# Patient Record
Sex: Female | Born: 1965 | Race: Black or African American | Hispanic: No | Smoking: Current every day smoker
Health system: Southern US, Community
[De-identification: ages and names within clinical notes are randomized; demographics above are authoritative.]

## PROBLEM LIST (undated history)

## (undated) DIAGNOSIS — R072 Precordial pain: Secondary | ICD-10-CM

## (undated) DIAGNOSIS — F1010 Alcohol abuse, uncomplicated: Secondary | ICD-10-CM

## (undated) DIAGNOSIS — F1910 Other psychoactive substance abuse, uncomplicated: Secondary | ICD-10-CM

## (undated) DIAGNOSIS — R062 Wheezing: Secondary | ICD-10-CM

## (undated) DIAGNOSIS — I10 Essential (primary) hypertension: Secondary | ICD-10-CM

## (undated) DIAGNOSIS — R079 Chest pain, unspecified: Secondary | ICD-10-CM

## (undated) DIAGNOSIS — F319 Bipolar disorder, unspecified: Secondary | ICD-10-CM

## (undated) DIAGNOSIS — F329 Major depressive disorder, single episode, unspecified: Secondary | ICD-10-CM

## (undated) DIAGNOSIS — Z91199 Patient's noncompliance with other medical treatment and regimen due to unspecified reason: Secondary | ICD-10-CM

## (undated) DIAGNOSIS — Z9119 Patient's noncompliance with other medical treatment and regimen: Secondary | ICD-10-CM

## (undated) DIAGNOSIS — Z720 Tobacco use: Secondary | ICD-10-CM

## (undated) DIAGNOSIS — F32A Depression, unspecified: Secondary | ICD-10-CM

## (undated) DIAGNOSIS — G43909 Migraine, unspecified, not intractable, without status migrainosus: Secondary | ICD-10-CM

## (undated) DIAGNOSIS — G44009 Cluster headache syndrome, unspecified, not intractable: Secondary | ICD-10-CM

## (undated) DIAGNOSIS — M1990 Unspecified osteoarthritis, unspecified site: Secondary | ICD-10-CM

## (undated) HISTORY — PX: TUBAL LIGATION: SHX77

## (undated) HISTORY — DX: Chest pain, unspecified: R07.9

## (undated) HISTORY — DX: Cluster headache syndrome, unspecified, not intractable: G44.009

## (undated) HISTORY — DX: Migraine, unspecified, not intractable, without status migrainosus: G43.909

## (undated) HISTORY — PX: APPENDECTOMY: SHX54

## (undated) HISTORY — PX: FINGER FRACTURE SURGERY: SHX638

## (undated) HISTORY — DX: Unspecified osteoarthritis, unspecified site: M19.90

## (undated) HISTORY — DX: Depression, unspecified: F32.A

## (undated) HISTORY — DX: Major depressive disorder, single episode, unspecified: F32.9

## (undated) HISTORY — PX: GASTRIC BYPASS: SHX52

## (undated) HISTORY — DX: Wheezing: R06.2

## (undated) HISTORY — PX: SKIN GRAFT: SHX250

---

## 2012-07-28 ENCOUNTER — Inpatient Hospital Stay: Payer: Self-pay | Admitting: Psychiatry

## 2012-07-28 LAB — CBC
HCT: 39.3 % (ref 35.0–47.0)
HGB: 13.1 g/dL (ref 12.0–16.0)
MCH: 23.9 pg — ABNORMAL LOW (ref 26.0–34.0)
MCHC: 33.3 g/dL (ref 32.0–36.0)
Platelet: 300 10*3/uL (ref 150–440)
RBC: 5.48 10*6/uL — ABNORMAL HIGH (ref 3.80–5.20)
RDW: 14.5 % (ref 11.5–14.5)

## 2012-07-28 LAB — COMPREHENSIVE METABOLIC PANEL
Albumin: 3.3 g/dL — ABNORMAL LOW (ref 3.4–5.0)
Alkaline Phosphatase: 68 U/L (ref 50–136)
BUN: 12 mg/dL (ref 7–18)
Bilirubin,Total: 0.5 mg/dL (ref 0.2–1.0)
Co2: 22 mmol/L (ref 21–32)
Creatinine: 0.6 mg/dL (ref 0.60–1.30)
EGFR (Non-African Amer.): >60
Osmolality: 279 (ref 275–301)
SGOT(AST): 27 U/L (ref 15–37)
SGPT (ALT): 26 U/L (ref 12–78)
Sodium: 139 mmol/L (ref 136–145)
Total Protein: 6.6 g/dL (ref 6.4–8.2)

## 2012-07-28 LAB — ETHANOL: Ethanol %: 0.1 % — ABNORMAL HIGH (ref 0.000–0.080)

## 2012-07-28 LAB — DRUG SCREEN, URINE
Amphetamines, Ur Screen: NEGATIVE (ref ?–1000)
Barbiturates, Ur Screen: NEGATIVE (ref ?–200)
Benzodiazepine, Ur Scrn: NEGATIVE (ref ?–200)
Methadone, Ur Screen: NEGATIVE (ref ?–300)
Opiate, Ur Screen: NEGATIVE (ref ?–300)
Phencyclidine (PCP) Ur S: NEGATIVE (ref ?–25)
Tricyclic, Ur Screen: NEGATIVE (ref ?–1000)

## 2012-07-28 LAB — PREGNANCY, URINE: Pregnancy Test, Urine: NEGATIVE m[IU]/mL

## 2012-07-28 LAB — URINALYSIS, COMPLETE
Blood: NEGATIVE
Glucose,UR: NEGATIVE mg/dL (ref 0–75)
Leukocyte Esterase: NEGATIVE
Nitrite: NEGATIVE
Ph: 5 (ref 4.5–8.0)
WBC UR: 2 /HPF (ref 0–5)

## 2012-07-28 LAB — SALICYLATE LEVEL: Salicylates, Serum: <1.7 mg/dL

## 2012-07-28 LAB — TSH: Thyroid Stimulating Horm: 0.18 u[IU]/mL — ABNORMAL LOW

## 2013-01-20 ENCOUNTER — Emergency Department: Payer: Self-pay | Admitting: Emergency Medicine

## 2013-01-20 LAB — CBC
HGB: 12.4 g/dL (ref 12.0–16.0)
MCHC: 32.6 g/dL (ref 32.0–36.0)
MCV: 74 fL — ABNORMAL LOW (ref 80–100)
RDW: 15.9 % — ABNORMAL HIGH (ref 11.5–14.5)

## 2013-01-20 LAB — COMPREHENSIVE METABOLIC PANEL
Albumin: 3.6 g/dL (ref 3.4–5.0)
Alkaline Phosphatase: 94 U/L (ref 50–136)
Anion Gap: 6 — ABNORMAL LOW (ref 7–16)
BUN: 10 mg/dL (ref 7–18)
Calcium, Total: 8.9 mg/dL (ref 8.5–10.1)
Chloride: 110 mmol/L — ABNORMAL HIGH (ref 98–107)
Co2: 25 mmol/L (ref 21–32)
Creatinine: 0.68 mg/dL (ref 0.60–1.30)
Glucose: 69 mg/dL (ref 65–99)
SGPT (ALT): 31 U/L (ref 12–78)
Total Protein: 7.2 g/dL (ref 6.4–8.2)

## 2013-01-20 LAB — TROPONIN I: Troponin-I: 0.04 ng/mL

## 2013-06-11 ENCOUNTER — Emergency Department: Payer: Self-pay

## 2013-06-11 LAB — CBC
MCH: 24.5 pg — ABNORMAL LOW (ref 26.0–34.0)
MCHC: 33.5 g/dL (ref 32.0–36.0)
MCV: 73 fL — ABNORMAL LOW (ref 80–100)
RBC: 5.6 10*6/uL — ABNORMAL HIGH (ref 3.80–5.20)
RDW: 14.6 % — ABNORMAL HIGH (ref 11.5–14.5)
WBC: 5.5 10*3/uL (ref 3.6–11.0)

## 2013-06-11 LAB — URINALYSIS, COMPLETE
Bacteria: NONE SEEN
Glucose,UR: NEGATIVE mg/dL (ref 0–75)
Ketone: NEGATIVE
Leukocyte Esterase: NEGATIVE
Nitrite: NEGATIVE
Protein: NEGATIVE
Specific Gravity: 1.014 (ref 1.003–1.030)

## 2013-06-11 LAB — COMPREHENSIVE METABOLIC PANEL
Anion Gap: 9 (ref 7–16)
BUN: 9 mg/dL (ref 7–18)
Bilirubin,Total: 0.7 mg/dL (ref 0.2–1.0)
Co2: 20 mmol/L — ABNORMAL LOW (ref 21–32)
EGFR (African American): >60
EGFR (Non-African Amer.): >60
Glucose: 109 mg/dL — ABNORMAL HIGH (ref 65–99)
Potassium: 4.1 mmol/L (ref 3.5–5.1)
SGOT(AST): 31 U/L (ref 15–37)
SGPT (ALT): 27 U/L (ref 12–78)
Sodium: 142 mmol/L (ref 136–145)
Total Protein: 7.2 g/dL (ref 6.4–8.2)

## 2013-06-11 LAB — DRUG SCREEN, URINE
Benzodiazepine, Ur Scrn: NEGATIVE (ref ?–200)
Cannabinoid 50 Ng, Ur ~~LOC~~: NEGATIVE (ref ?–50)
MDMA (Ecstasy)Ur Screen: NEGATIVE (ref ?–500)
Methadone, Ur Screen: NEGATIVE (ref ?–300)
Phencyclidine (PCP) Ur S: NEGATIVE (ref ?–25)
Tricyclic, Ur Screen: NEGATIVE (ref ?–1000)

## 2013-06-11 LAB — ETHANOL
Ethanol %: 0.153 % — ABNORMAL HIGH (ref 0.000–0.080)
Ethanol: 153 mg/dL
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
## 2014-12-26 NOTE — Consult Note (Signed)
Brief Consult Note: Diagnosis: depression nos, alcohol abuse.   Patient was seen by consultant.   Consult note dictated.   Orders entered.   Comments: Psychiatry: Patient seen. Chart reviewed. Patient was intoxicated last night and made suicidal stattements but today is sober and denies any suicidal ideation. Lucid, able to make clear decisions. No hx dts or seizures. REviewed hx and dx with patient and agreed to restart prozac 40mg  a day and remeron 15mg  qhs for mood and sleep. [facility].  Electronic Signatures: Audery Amellapacs, Nirvaan Frett T (MD)  (Signed 08-Oct-14 15:07)  Authored: Brief Consult Note   Last Updated: 08-Oct-14 15:07 by Audery Amellapacs, Mohannad Olivero T (MD)

## 2015-01-15 ENCOUNTER — Emergency Department
Admission: EM | Admit: 2015-01-15 | Discharge: 2015-01-15 | Disposition: A | Discharge status: Home / Self Care | Payer: BLUE CROSS/BLUE SHIELD | Attending: Emergency Medicine | Admitting: Emergency Medicine

## 2015-01-15 DIAGNOSIS — S0011XA Contusion of right eyelid and periocular area, initial encounter: Secondary | ICD-10-CM | POA: Diagnosis not present

## 2015-01-15 DIAGNOSIS — I10 Essential (primary) hypertension: Secondary | ICD-10-CM | POA: Diagnosis not present

## 2015-01-15 DIAGNOSIS — Y9289 Other specified places as the place of occurrence of the external cause: Secondary | ICD-10-CM | POA: Insufficient documentation

## 2015-01-15 DIAGNOSIS — S0511XA Contusion of eyeball and orbital tissues, right eye, initial encounter: Secondary | ICD-10-CM

## 2015-01-15 DIAGNOSIS — Y9389 Activity, other specified: Secondary | ICD-10-CM | POA: Insufficient documentation

## 2015-01-15 DIAGNOSIS — S0591XA Unspecified injury of right eye and orbit, initial encounter: Secondary | ICD-10-CM | POA: Diagnosis present

## 2015-01-15 DIAGNOSIS — H1131 Conjunctival hemorrhage, right eye: Secondary | ICD-10-CM | POA: Insufficient documentation

## 2015-01-15 DIAGNOSIS — Z720 Tobacco use: Secondary | ICD-10-CM | POA: Diagnosis not present

## 2015-01-15 DIAGNOSIS — Y998 Other external cause status: Secondary | ICD-10-CM | POA: Insufficient documentation

## 2015-01-15 HISTORY — DX: Bipolar disorder, unspecified: F31.9
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
## 2015-01-15 NOTE — ED Notes (Signed)
Pt offered chance to report to [employer].  Pt refused.  SANE nurse, Toma CopierSherie, notified and at pt bedside.
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
## 2015-01-28 ENCOUNTER — Encounter: Payer: Self-pay | Admitting: Emergency Medicine

## 2015-01-28 DIAGNOSIS — R748 Abnormal levels of other serum enzymes: Secondary | ICD-10-CM | POA: Diagnosis not present

## 2015-01-28 DIAGNOSIS — R45851 Suicidal ideations: Secondary | ICD-10-CM | POA: Insufficient documentation

## 2015-01-28 DIAGNOSIS — F329 Major depressive disorder, single episode, unspecified: Secondary | ICD-10-CM | POA: Diagnosis present

## 2015-01-28 DIAGNOSIS — F319 Bipolar disorder, unspecified: Secondary | ICD-10-CM | POA: Diagnosis present

## 2015-01-28 DIAGNOSIS — R072 Precordial pain: Secondary | ICD-10-CM | POA: Diagnosis not present

## 2015-01-28 DIAGNOSIS — E785 Hyperlipidemia, unspecified: Secondary | ICD-10-CM | POA: Insufficient documentation

## 2015-01-28 DIAGNOSIS — Z836 Family history of other diseases of the respiratory system: Secondary | ICD-10-CM | POA: Diagnosis not present

## 2015-01-28 DIAGNOSIS — F1010 Alcohol abuse, uncomplicated: Secondary | ICD-10-CM | POA: Insufficient documentation

## 2015-01-28 DIAGNOSIS — Z8489 Family history of other specified conditions: Secondary | ICD-10-CM | POA: Diagnosis not present

## 2015-01-28 DIAGNOSIS — Z9114 Patient's other noncompliance with medication regimen: Secondary | ICD-10-CM | POA: Insufficient documentation

## 2015-01-28 DIAGNOSIS — I10 Essential (primary) hypertension: Secondary | ICD-10-CM | POA: Diagnosis not present

## 2015-01-28 DIAGNOSIS — Z833 Family history of diabetes mellitus: Secondary | ICD-10-CM | POA: Insufficient documentation

## 2015-01-28 DIAGNOSIS — Z9889 Other specified postprocedural states: Secondary | ICD-10-CM | POA: Diagnosis not present

## 2015-01-28 DIAGNOSIS — Z980 Intestinal bypass and anastomosis status: Secondary | ICD-10-CM | POA: Insufficient documentation

## 2015-01-28 DIAGNOSIS — Z91018 Allergy to other foods: Secondary | ICD-10-CM | POA: Diagnosis not present

## 2015-01-28 DIAGNOSIS — F17210 Nicotine dependence, cigarettes, uncomplicated: Secondary | ICD-10-CM | POA: Insufficient documentation

## 2015-01-28 DIAGNOSIS — R079 Chest pain, unspecified: Secondary | ICD-10-CM | POA: Insufficient documentation

## 2015-01-28 DIAGNOSIS — Z8249 Family history of ischemic heart disease and other diseases of the circulatory system: Secondary | ICD-10-CM | POA: Diagnosis not present

## 2015-01-28 DIAGNOSIS — R7989 Other specified abnormal findings of blood chemistry: Secondary | ICD-10-CM | POA: Diagnosis present

## 2015-01-28 DIAGNOSIS — Z79899 Other long term (current) drug therapy: Secondary | ICD-10-CM | POA: Diagnosis not present

## 2015-01-28 DIAGNOSIS — R739 Hyperglycemia, unspecified: Secondary | ICD-10-CM | POA: Insufficient documentation

## 2015-01-28 LAB — COMPREHENSIVE METABOLIC PANEL
ALK PHOS: 71 U/L (ref 38–126)
ALT: 29 U/L (ref 14–54)
AST: 46 U/L — AB (ref 15–41)
Albumin: 3.9 g/dL (ref 3.5–5.0)
Anion gap: 9 (ref 5–15)
BILIRUBIN TOTAL: 0.4 mg/dL (ref 0.3–1.2)
BUN: 18 mg/dL (ref 6–20)
CALCIUM: 8.9 mg/dL (ref 8.9–10.3)
CO2: 24 mmol/L (ref 22–32)
CREATININE: 0.93 mg/dL (ref 0.44–1.00)
Chloride: 104 mmol/L (ref 101–111)
GFR calc non Af Amer: >60 mL/min (ref >60)
Glucose, Bld: 145 mg/dL — ABNORMAL HIGH (ref 65–99)
POTASSIUM: 4.2 mmol/L (ref 3.5–5.1)
SODIUM: 137 mmol/L (ref 135–145)
TOTAL PROTEIN: 7.6 g/dL (ref 6.5–8.1)

## 2015-01-28 LAB — CBC
HEMATOCRIT: 38.7 % (ref 35.0–47.0)
HEMOGLOBIN: 12.4 g/dL (ref 12.0–16.0)
MCH: 23.9 pg — ABNORMAL LOW (ref 26.0–34.0)
MCHC: 31.9 g/dL — AB (ref 32.0–36.0)
MCV: 75.1 fL — ABNORMAL LOW (ref 80.0–100.0)
Platelets: 267 10*3/uL (ref 150–440)
RBC: 5.16 MIL/uL (ref 3.80–5.20)
RDW: 16 % — ABNORMAL HIGH (ref 11.5–14.5)
WBC: 4.7 10*3/uL (ref 3.6–11.0)

## 2015-01-28 LAB — URINE DRUG SCREEN, QUALITATIVE (ARMC ONLY)
Amphetamines, Ur Screen: NOT DETECTED
BENZODIAZEPINE, UR SCRN: NOT DETECTED
Barbiturates, Ur Screen: NOT DETECTED
CANNABINOID 50 NG, UR ~~LOC~~: NOT DETECTED
COCAINE METABOLITE, UR ~~LOC~~: NOT DETECTED
MDMA (Ecstasy)Ur Screen: NOT DETECTED
Methadone Scn, Ur: NOT DETECTED
Opiate, Ur Screen: NOT DETECTED
PHENCYCLIDINE (PCP) UR S: NOT DETECTED
TRICYCLIC, UR SCREEN: NOT DETECTED

## 2015-01-28 LAB — ETHANOL: Alcohol, Ethyl (B): <5 mg/dL (ref <5)

## 2015-01-28 LAB — ACETAMINOPHEN LEVEL: Acetaminophen (Tylenol), Serum: <10 ug/mL — ABNORMAL LOW (ref 10–30)

## 2015-01-28 LAB — SALICYLATE LEVEL: Salicylate Lvl: <4 mg/dL (ref 2.8–30.0)

## 2015-01-28 NOTE — ED Notes (Signed)
Pt's friend left number in case of emergency Tara Lamb IllDeborah Boone [phone]
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
## 2015-01-29 ENCOUNTER — Inpatient Hospital Stay (HOSPITAL_BASED_OUTPATIENT_CLINIC_OR_DEPARTMENT_OTHER)
Admit: 2015-01-29 | Discharge: 2015-01-29 | Disposition: A | Discharge status: Home / Self Care | Payer: BLUE CROSS/BLUE SHIELD | Attending: Internal Medicine | Admitting: Internal Medicine

## 2015-01-29 ENCOUNTER — Observation Stay
Admission: EM | Admit: 2015-01-29 | Discharge: 2015-01-30 | Disposition: A | Discharge status: Home / Self Care | Payer: BLUE CROSS/BLUE SHIELD | Attending: Internal Medicine | Admitting: Internal Medicine

## 2015-01-29 ENCOUNTER — Encounter: Payer: Self-pay | Admitting: Internal Medicine

## 2015-01-29 DIAGNOSIS — F329 Major depressive disorder, single episode, unspecified: Secondary | ICD-10-CM

## 2015-01-29 DIAGNOSIS — R778 Other specified abnormalities of plasma proteins: Secondary | ICD-10-CM | POA: Diagnosis present

## 2015-01-29 DIAGNOSIS — R7989 Other specified abnormal findings of blood chemistry: Secondary | ICD-10-CM | POA: Diagnosis not present

## 2015-01-29 DIAGNOSIS — R079 Chest pain, unspecified: Secondary | ICD-10-CM | POA: Diagnosis not present

## 2015-01-29 DIAGNOSIS — F1010 Alcohol abuse, uncomplicated: Secondary | ICD-10-CM | POA: Diagnosis present

## 2015-01-29 DIAGNOSIS — F1910 Other psychoactive substance abuse, uncomplicated: Secondary | ICD-10-CM | POA: Diagnosis present

## 2015-01-29 DIAGNOSIS — Z9119 Patient's noncompliance with other medical treatment and regimen: Secondary | ICD-10-CM

## 2015-01-29 DIAGNOSIS — I209 Angina pectoris, unspecified: Secondary | ICD-10-CM | POA: Insufficient documentation

## 2015-01-29 DIAGNOSIS — I10 Essential (primary) hypertension: Secondary | ICD-10-CM | POA: Diagnosis present

## 2015-01-29 DIAGNOSIS — I159 Secondary hypertension, unspecified: Secondary | ICD-10-CM

## 2015-01-29 DIAGNOSIS — R072 Precordial pain: Secondary | ICD-10-CM | POA: Diagnosis present

## 2015-01-29 DIAGNOSIS — F32A Depression, unspecified: Secondary | ICD-10-CM

## 2015-01-29 DIAGNOSIS — F319 Bipolar disorder, unspecified: Secondary | ICD-10-CM | POA: Diagnosis present

## 2015-01-29 DIAGNOSIS — Z720 Tobacco use: Secondary | ICD-10-CM | POA: Diagnosis present

## 2015-01-29 DIAGNOSIS — Z91199 Patient's noncompliance with other medical treatment and regimen due to unspecified reason: Secondary | ICD-10-CM

## 2015-01-29 HISTORY — DX: Essential (primary) hypertension: I10

## 2015-01-29 HISTORY — DX: Other psychoactive substance abuse, uncomplicated: F19.10

## 2015-01-29 HISTORY — DX: Patient's noncompliance with other medical treatment and regimen due to unspecified reason: Z91.199

## 2015-01-29 HISTORY — DX: Tobacco use: Z72.0

## 2015-01-29 HISTORY — DX: Precordial pain: R07.2

## 2015-01-29 HISTORY — DX: Alcohol abuse, uncomplicated: F10.10

## 2015-01-29 HISTORY — DX: Patient's noncompliance with other medical treatment and regimen: Z91.19

## 2015-01-29 LAB — MRSA PCR SCREENING: MRSA by PCR: NEGATIVE

## 2015-01-29 LAB — TROPONIN I
TROPONIN I: 0.09 ng/mL — AB (ref <0.031)
Troponin I: 0.08 ng/mL — ABNORMAL HIGH (ref <0.031)
Troponin I: 0.08 ng/mL — ABNORMAL HIGH (ref <0.031)
Troponin I: 0.08 ng/mL — ABNORMAL HIGH (ref <0.031)

## 2015-01-29 LAB — HEMOGLOBIN A1C: Hgb A1c MFr Bld: 5.9 % (ref 4.0–6.0)

## 2015-01-29 MED ORDER — METOPROLOL SUCCINATE ER 100 MG PO TB24
100.0000 mg | ORAL_TABLET | Freq: Every day | ORAL | Status: DC
  Administered 2015-01-30: 100 mg via ORAL
  Filled 2015-01-29: qty 1

## 2015-01-29 MED ORDER — ACETAMINOPHEN 650 MG RE SUPP: 650.0000 mg | Freq: Four times a day (QID) | RECTAL | Status: DC | PRN

## 2015-01-29 MED ORDER — NITROGLYCERIN 2 % TD OINT
1.0000 [in_us] | TOPICAL_OINTMENT | Freq: Once | TRANSDERMAL | Status: AC
  Administered 2015-01-29: 1 [in_us] via TOPICAL

## 2015-01-29 MED ORDER — HYDROCHLOROTHIAZIDE 25 MG PO TABS
25.0000 mg | ORAL_TABLET | Freq: Every day | ORAL | Status: DC
  Administered 2015-01-29 – 2015-01-30 (×2): 25 mg via ORAL
  Filled 2015-01-29 (×2): qty 1

## 2015-01-29 MED ORDER — SENNOSIDES-DOCUSATE SODIUM 8.6-50 MG PO TABS: 1.0000 | ORAL_TABLET | Freq: Every evening | ORAL | Status: DC | PRN

## 2015-01-29 MED ORDER — NITROGLYCERIN 2 % TD OINT
TOPICAL_OINTMENT | TRANSDERMAL | Status: AC
  Administered 2015-01-29: 1 [in_us] via TOPICAL
  Filled 2015-01-29: qty 1

## 2015-01-29 MED ORDER — ASPIRIN EC 81 MG PO TBEC
81.0000 mg | DELAYED_RELEASE_TABLET | Freq: Every day | ORAL | Status: DC
  Administered 2015-01-30: 81 mg via ORAL
  Filled 2015-01-29 (×2): qty 1

## 2015-01-29 MED ORDER — ASPIRIN 81 MG PO CHEW
CHEWABLE_TABLET | ORAL | Status: AC
  Administered 2015-01-29: 324 mg via ORAL
  Filled 2015-01-29: qty 4

## 2015-01-29 MED ORDER — ENOXAPARIN SODIUM 40 MG/0.4ML ~~LOC~~ SOLN
40.0000 mg | SUBCUTANEOUS | Status: DC
  Administered 2015-01-29: 40 mg via SUBCUTANEOUS
  Filled 2015-01-29 (×2): qty 0.4

## 2015-01-29 MED ORDER — ATORVASTATIN CALCIUM 20 MG PO TABS
80.0000 mg | ORAL_TABLET | Freq: Every day | ORAL | Status: DC
  Administered 2015-01-29: 80 mg via ORAL
  Filled 2015-01-29: qty 4

## 2015-01-29 MED ORDER — LISINOPRIL 20 MG PO TABS
20.0000 mg | ORAL_TABLET | Freq: Every day | ORAL | Status: DC
  Administered 2015-01-29: 20 mg via ORAL
  Filled 2015-01-29: qty 1

## 2015-01-29 MED ORDER — HYDRALAZINE HCL 20 MG/ML IJ SOLN
INTRAMUSCULAR | Status: AC
  Administered 2015-01-29: 10 mg via INTRAVENOUS
  Filled 2015-01-29: qty 1

## 2015-01-29 MED ORDER — LISINOPRIL 20 MG PO TABS
40.0000 mg | ORAL_TABLET | Freq: Every day | ORAL | Status: DC
  Administered 2015-01-30: 40 mg via ORAL
  Filled 2015-01-29: qty 2

## 2015-01-29 MED ORDER — ONDANSETRON HCL 4 MG/2ML IJ SOLN: 4.0000 mg | Freq: Four times a day (QID) | INTRAMUSCULAR | Status: DC | PRN

## 2015-01-29 MED ORDER — ACETAMINOPHEN 325 MG PO TABS
650.0000 mg | ORAL_TABLET | Freq: Four times a day (QID) | ORAL | Status: DC | PRN
  Administered 2015-01-29 – 2015-01-30 (×3): 650 mg via ORAL
  Filled 2015-01-29 (×3): qty 2

## 2015-01-29 MED ORDER — ASPIRIN 81 MG PO CHEW
324.0000 mg | CHEWABLE_TABLET | Freq: Once | ORAL | Status: AC
  Administered 2015-01-29: 324 mg via ORAL

## 2015-01-29 MED ORDER — FLUOXETINE HCL 20 MG PO CAPS
40.0000 mg | ORAL_CAPSULE | Freq: Every day | ORAL | Status: DC
  Administered 2015-01-29 – 2015-01-30 (×2): 40 mg via ORAL
  Filled 2015-01-29 (×2): qty 2

## 2015-01-29 MED ORDER — ONDANSETRON HCL 4 MG PO TABS
4.0000 mg | ORAL_TABLET | Freq: Four times a day (QID) | ORAL | Status: DC | PRN
Start: 2015-01-29 — End: 2015-01-30

## 2015-01-29 MED ORDER — REGADENOSON 0.4 MG/5ML IV SOLN
0.4000 mg | Freq: Once | INTRAVENOUS | Status: AC
  Administered 2015-01-30: 0.4 mg via INTRAVENOUS
  Filled 2015-01-29: qty 5

## 2015-01-29 MED ORDER — METOPROLOL SUCCINATE ER 50 MG PO TB24: 50.0000 mg | ORAL_TABLET | Freq: Every day | ORAL | Status: DC

## 2015-01-29 MED ORDER — HYDRALAZINE HCL 20 MG/ML IJ SOLN
10.0000 mg | INTRAMUSCULAR | Status: DC | PRN
  Administered 2015-01-29 (×2): 10 mg via INTRAVENOUS
  Filled 2015-01-29: qty 1

## 2015-01-29 NOTE — ED Notes (Signed)
BEHAVIORAL HEALTH ROUNDING Patient sleeping: No. Patient alert and oriented: yes Behavior appropriate: Yes.  ; If no, describe:  Nutrition and fluids offered: Yes  Toileting and hygiene offered: Yes  Sitter present: yes Law enforcement present: Yes [employer] security 

## 2015-01-29 NOTE — ED Provider Notes (Signed)
-----------------------------------------   7:23 AM on 01/29/2015 -----------------------------------------   BP 178/101 mmHg  Pulse 88  Temp(Src) 98.4 F (36.9 C) (Oral)  Resp 16  Ht 5' (1.524 m)  Wt 168 lb (76.204 kg)  BMI 32.81 kg/m2  SpO2 100%  The patient had no acute events since last update.  Calm and cooperative at this time.  Disposition is pending per Psychiatry/Behavioral Medicine team recommendations.  Will watch bp    Arnaldo NatalPaul F Yania Bogie, MD 01/29/15 [phone]
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
## 2015-01-29 NOTE — Care Management (Cosign Needed)
Primary nurse stated patient made statement that she could not afford her meds.  CM met with patient.  She was engaged in conversation , made direct eye contact and smiling.  Said she has bipolar depression and was off of her meds due to lack of insurance.  She is now working, has Scientist, product/process development and is now able to obtain her medications.  She is also followed by [employer].  She denies feeling depressed now that she is on her medications. Updated CSW.

## 2015-01-29 NOTE — ED Notes (Signed)
Pt given a meal.
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
## 2015-01-29 NOTE — Progress Notes (Signed)
Cardiology called to verify contrast order that is scheduled for midnight.  [place] notified, MD said the contrast is administered in radiology during procedure.  Thank you!
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
## 2015-01-29 NOTE — ED Notes (Signed)
Verified with Dr. Dolores FrameSung no sitter needed for inpatient status.

## 2015-01-29 NOTE — Progress Notes (Signed)
[facility] [place] [facility]                                                                                                                                                                                            Patient Demographics   Jonnie FinnerLisa Melott, is a 49 y.o. female, DOB - June 14, 1966, [id number]  Admit date - 01/29/2015   Admitting Physician Crissie FiguresEdavally N Reddy, MD  Outpatient Primary MD for the patient is No PCP Per Patient  LOS -   Chief Complaint  Patient presents with  . Depression      Pt also has been having cp for past few days, no sob, also ran out of meds few years.   Review of Systems:   CONSTITUTIONAL: No documented fever. No fatigue, weakness. No weight gain, no weight loss.  EYES: No blurry or double vision.  ENT: No tinnitus. No postnasal drip. No redness of the oropharynx.  RESPIRATORY: No cough, no wheeze, no hemoptysis. No dyspnea.  CARDIOVASCULAR: Positive chest pain. No orthopnea. No palpitations. No syncope.  GASTROINTESTINAL: No nausea, no vomiting or diarrhea. No abdominal pain. No melena or hematochezia.  GENITOURINARY: No dysuria or hematuria.  ENDOCRINE: No polyuria or nocturia. No heat or cold intolerance.  HEMATOLOGY: No anemia. No bruising. No bleeding.  INTEGUMENTARY: No rashes. No lesions.  MUSCULOSKELETAL: No arthritis. No swelling. No gout.  NEUROLOGIC: No numbness, tingling, or ataxia. No seizure-type activity.  PSYCHIATRIC: No anxiety. No insomnia. No ADD.    Vitals:   Filed Vitals:   01/29/15 [id number] 01/29/15 0645 01/29/15 0836 01/29/15 0942  BP: 183/92 178/101 155/91 177/96  Pulse:  88 84 86  Temp:   98 F (36.7 C) 98.1 F (36.7 C)  TempSrc:   Oral Oral  Resp:  16 18 16   Height:      Weight:    77.792 kg (171 lb 8 oz)  SpO2:  100% 96% 100%    Wt Readings from Last 3 Encounters:  01/29/15 77.792 kg (171 lb 8 oz)  01/15/15 77.111 kg (170 lb)    No intake or output data in the 24 hours ending  01/29/15 1230  Physical Exam:   GENERAL: Pleasant-appearing in no apparent distress.  HEAD, EYES, EARS, NOSE AND THROAT: Atraumatic, normocephalic. Extraocular muscles are intact. Pupils equal and reactive to light. Sclerae anicteric. No conjunctival injection. No oro-pharyngeal erythema.  NECK: Supple. There is no jugular venous distention. No bruits, no lymphadenopathy, no thyromegaly.  HEART: Regular rate and rhythm, tachycardic. No murmurs, no rubs, no clicks.  LUNGS: Clear to auscultation bilaterally. No rales or rhonchi. No wheezes.  ABDOMEN: Soft, flat, nontender, nondistended. Has good bowel sounds. No hepatosplenomegaly appreciated.  EXTREMITIES: No evidence of any cyanosis, clubbing, or peripheral edema.  +2 pedal and radial pulses bilaterally.  NEUROLOGIC: The patient is alert, awake, and oriented x3 with no focal motor or sensory deficits appreciated bilaterally.  SKIN: Moist and warm with no rashes appreciated.  Psych: Not anxious, depressed LN: No inguinal LN enlargement    Antibiotics   Anti-infectives    None      Medications   Scheduled Meds: . aspirin EC  81 mg Oral Daily  . enoxaparin (LOVENOX) injection  40 mg Subcutaneous Q24H  . hydrochlorothiazide  25 mg Oral Daily  . lisinopril  20 mg Oral Daily  . [START ON 01/30/2015] metoprolol succinate  100 mg Oral Daily   Continuous Infusions:  PRN Meds:.acetaminophen **OR** acetaminophen, hydrALAZINE, ondansetron **OR** ondansetron (ZOFRAN) IV, senna-docusate   Data Review:   Micro Results No results found for this or any previous visit (from the past 240 hour(s)).  Radiology Reports No results found.   CBC  Recent Labs Lab 01/28/15 1801  WBC 4.7  HGB 12.4  HCT 38.7  PLT 267  MCV 75.1*  MCH 23.9*  MCHC 31.9*  RDW 16.0*    Chemistries   Recent Labs Lab 01/28/15 1801  NA 137  K 4.2  CL 104  CO2 24  GLUCOSE 145*  BUN 18  CREATININE 0.93  CALCIUM 8.9  AST 46*  ALT 29  ALKPHOS 71   BILITOT 0.4   ------------------------------------------------------------------------------------------------------------------ estimated creatinine clearance is 67.5 mL/min (by C-G formula based on Cr of 0.93). ------------------------------------------------------------------------------------------------------------------ No results for input(s): HGBA1C in the last 72 hours. ------------------------------------------------------------------------------------------------------------------ No results for input(s): CHOL, HDL, LDLCALC, TRIG, CHOLHDL, LDLDIRECT in the last 72 hours. ------------------------------------------------------------------------------------------------------------------ No results for input(s): TSH, T4TOTAL, T3FREE, THYROIDAB in the last 72 hours.  Invalid input(s): FREET3 ------------------------------------------------------------------------------------------------------------------ No results for input(s): VITAMINB12, FOLATE, FERRITIN, TIBC, IRON, RETICCTPCT in the last 72 hours.  Coagulation profile No results for input(s): INR, PROTIME in the last 168 hours.  No results for input(s): DDIMER in the last 72 hours.  Cardiac Enzymes  Recent Labs Lab 01/28/15 0001  TROPONINI 0.08*   ------------------------------------------------------------------------------------------------------------------ Invalid input(s): POCBNP    Assessment & Plan   1. Elevated troponin. EKG with T-wave inversions in lateral leads.  She has cp, cardiolgy eval pending.  2. Elevated blood pressure, history of hypertension, patient ran out of medications many years ago.  Continue hctz, lisinopril and metoplrol..  3. Bipolar disorder, patient ran out of medications many years ago, depressed lately, awaiting psych evaluation. 4. Elevated blood sugar, no history of diabetes but family history of diabetes. plan check A1c and follow-up accordingly. 5. Tobacco usage, continuous.  Counseled to quit,spent spent recommend pt to stop smoking 6. History of cocaine usage and marijuana usage. States last time she used cocaine was in January 2016 and marijuana on 01/21/2015. UDS negative. Monitor       Code Status Orders        Start     Ordered   01/29/15 1047  Full code   Continuous     01/29/15 1046      Family Communication: patient  Disposition Plan:home   Consults  Cardiology, pulmonary  DVT Prophylaxis   scd's    Lab Results  Component Value Date   PLT 267 01/28/2015     Time Spent in minutes     Auburn Bilberry M.D on 01/29/2015 at 12:30 PM  Between 7am to 6pm - Pager - [phone]  After 6pm go to [email or web address] - password [email or web address] [facility]  [place] [place] [facility]   Office  [phone]
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
## 2015-01-29 NOTE — ED Notes (Signed)

## 2015-01-29 NOTE — ED Notes (Signed)
BEHAVIORAL HEALTH ROUNDING Patient sleeping: No. Patient alert and oriented: yes Behavior appropriate: Yes.  ; If no, describe:  Nutrition and fluids offered: Yes  Toileting and hygiene offered: Yes  Sitter present: no Law enforcement present: Yes  

## 2015-01-29 NOTE — Progress Notes (Signed)
Upon admission to this unit, patient has flight risk orders. MD notified. Orders to discontinue the flight risk orders. Will continue to assess.

## 2015-01-30 ENCOUNTER — Inpatient Hospital Stay (HOSPITAL_BASED_OUTPATIENT_CLINIC_OR_DEPARTMENT_OTHER): Payer: BLUE CROSS/BLUE SHIELD

## 2015-01-30 DIAGNOSIS — R072 Precordial pain: Secondary | ICD-10-CM

## 2015-01-30 LAB — NM MYOCAR MULTI W/SPECT W/WALL MOTION / EF
CHL CUP NUCLEAR SRS: 3
CHL CUP STRESS STAGE 5 HR: 122 {beats}/min
CHL CUP STRESS STAGE 6 GRADE: 0 %
CHL CUP STRESS STAGE 6 SPEED: 0 mph
CSEPPHR: 120 {beats}/min
Estimated workload: 1 METS
LV dias vol: 97 mL
LVSYSVOL: 63 mL
Percent of predicted max HR: 70 %
Rest HR: 72 {beats}/min
SDS: 0
SSS: 2
Stage 1 HR: 73 {beats}/min
Stage 2 Grade: 0 %
Stage 2 HR: 80 {beats}/min
Stage 2 Speed: 0 mph
Stage 3 Grade: 0 %
Stage 3 HR: 80 {beats}/min
Stage 3 Speed: 0 mph
Stage 4 Grade: 0 %
Stage 4 HR: 120 {beats}/min
Stage 4 Speed: 0 mph
Stage 5 Grade: 0 %
Stage 5 Speed: 0 mph
Stage 6 DBP: 97 mmHg
Stage 6 HR: 93 {beats}/min
Stage 6 SBP: 186 mmHg
TID: 1.13

## 2015-01-30 LAB — LIPID PANEL
CHOL/HDL RATIO: 2.2 ratio
Cholesterol: 182 mg/dL (ref 0–200)
HDL: 83 mg/dL (ref >40)
LDL Cholesterol: 83 mg/dL (ref 0–99)
Triglycerides: 79 mg/dL (ref <150)
VLDL: 16 mg/dL (ref 0–40)

## 2015-01-30 MED ORDER — TECHNETIUM TC 99M SESTAMIBI - CARDIOLITE
14.3600 | Freq: Once | INTRAVENOUS | Status: AC | PRN
  Administered 2015-01-30: 09:00:00 14.36 via INTRAVENOUS

## 2015-01-30 MED ORDER — ARIPIPRAZOLE 10 MG PO TABS: 10.0000 mg | ORAL_TABLET | Freq: Every day | ORAL | Status: DC

## 2015-01-30 MED ORDER — METOPROLOL SUCCINATE ER 100 MG PO TB24: 100.0000 mg | ORAL_TABLET | Freq: Every day | ORAL | Status: DC

## 2015-01-30 MED ORDER — ASPIRIN 81 MG PO TBEC
81.0000 mg | DELAYED_RELEASE_TABLET | Freq: Every day | ORAL | Status: DC
Start: 2015-01-30 — End: 2018-03-09

## 2015-01-30 MED ORDER — FLUOXETINE HCL 40 MG PO CAPS: 40.0000 mg | ORAL_CAPSULE | Freq: Every day | ORAL | Status: DC

## 2015-01-30 MED ORDER — HYDROCHLOROTHIAZIDE 25 MG PO TABS: 25.0000 mg | ORAL_TABLET | Freq: Every day | ORAL | Status: DC

## 2015-01-30 MED ORDER — TECHNETIUM TC 99M SESTAMIBI - CARDIOLITE
33.1000 | Freq: Once | INTRAVENOUS | Status: AC | PRN
  Administered 2015-01-30: 33.1 via INTRAVENOUS

## 2015-01-30 MED ORDER — VALSARTAN-HYDROCHLOROTHIAZIDE 160-25 MG PO TABS: 1.0000 | ORAL_TABLET | Freq: Every day | ORAL | Status: DC

## 2015-01-30 NOTE — Progress Notes (Signed)
Discharge instructions given. Education given: metoprolol, abilify, prozac, losartan-HCTZ, along with depression handout. Patient has no further questions. Given a list of available primary care providers in the area. Patient will follow up with new PCP, psych doctor, and Dr. Windell HummingbirdGollan's office. IV and tele discontinued.
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
## 2015-01-30 NOTE — Discharge Instructions (Signed)
°  DIET:  °Cardiac diet ° °DISCHARGE CONDITION:  °Good ° °ACTIVITY:  °Activity as tolerated ° °OXYGEN:  °Home Oxygen: No. °  °Oxygen Delivery: room air ° °DISCHARGE LOCATION:  °home  ° °If you experience worsening of your admission symptoms, develop shortness of breath, life threatening emergency, suicidal or homicidal thoughts you must seek medical attention immediately by calling 911 or calling your MD immediately  if symptoms less severe. ° °You Must read complete instructions/literature along with all the possible adverse reactions/side effects for all the Medicines you take and that have been prescribed to you. Take any new Medicines after you have completely understood and accpet all the possible adverse reactions/side effects.  ° °Please note ° °You were cared for by a hospitalist during your hospital stay. If you have any questions about your discharge medications or the care you received while you were in the hospital after you are discharged, you can call the unit and asked to speak with the hospitalist on call if the hospitalist that took care of you is not available. Once you are discharged, your primary care physician will handle any further medical issues. Please note that NO REFILLS for any discharge medications will be authorized once you are discharged, as it is imperative that you return to your primary care physician (or establish a relationship with a primary care physician if you do not have one) for your aftercare needs so that they can reassess your need for medications and monitor your lab values. ° ° ° °

## 2015-01-30 NOTE — Progress Notes (Signed)
[facility] [place] [facility]        Jonnie FinnerLisa Lamb was admitted to the Hospital on 01/29/2015 and Discharged  01/30/2015 and should be excused from work/school   for 2 days starting 01/29/2015 , may return to work/school without any restrictions.   Auburn BilberryPATEL, Elzie Knisley M.D on 01/30/2015,at 3:36 PM  [facility] [place] [facility]    Office  [phone]

## 2015-02-10 ENCOUNTER — Emergency Department: Payer: BLUE CROSS/BLUE SHIELD

## 2015-02-10 ENCOUNTER — Emergency Department
Admission: EM | Admit: 2015-02-10 | Discharge: 2015-02-10 | Disposition: A | Discharge status: Home / Self Care | Payer: BLUE CROSS/BLUE SHIELD | Attending: Emergency Medicine | Admitting: Emergency Medicine

## 2015-02-10 ENCOUNTER — Other Ambulatory Visit: Payer: Self-pay

## 2015-02-10 ENCOUNTER — Encounter: Payer: Self-pay | Admitting: General Practice

## 2015-02-10 DIAGNOSIS — I10 Essential (primary) hypertension: Secondary | ICD-10-CM | POA: Diagnosis not present

## 2015-02-10 DIAGNOSIS — R072 Precordial pain: Secondary | ICD-10-CM | POA: Insufficient documentation

## 2015-02-10 DIAGNOSIS — Z7982 Long term (current) use of aspirin: Secondary | ICD-10-CM | POA: Insufficient documentation

## 2015-02-10 DIAGNOSIS — R630 Anorexia: Secondary | ICD-10-CM | POA: Diagnosis not present

## 2015-02-10 DIAGNOSIS — Z79899 Other long term (current) drug therapy: Secondary | ICD-10-CM | POA: Insufficient documentation

## 2015-02-10 DIAGNOSIS — Z720 Tobacco use: Secondary | ICD-10-CM | POA: Insufficient documentation

## 2015-02-10 DIAGNOSIS — M7121 Synovial cyst of popliteal space [Baker], right knee: Secondary | ICD-10-CM | POA: Diagnosis not present

## 2015-02-10 DIAGNOSIS — R079 Chest pain, unspecified: Secondary | ICD-10-CM | POA: Diagnosis present

## 2015-02-10 LAB — CBC
HCT: 37.9 % (ref 35.0–47.0)
Hemoglobin: 12.2 g/dL (ref 12.0–16.0)
MCH: 24.2 pg — AB (ref 26.0–34.0)
MCHC: 32.3 g/dL (ref 32.0–36.0)
MCV: 74.7 fL — AB (ref 80.0–100.0)
Platelets: 339 10*3/uL (ref 150–440)
RBC: 5.07 MIL/uL (ref 3.80–5.20)
RDW: 15.9 % — AB (ref 11.5–14.5)
WBC: 4.9 10*3/uL (ref 3.6–11.0)

## 2015-02-10 LAB — BASIC METABOLIC PANEL
Anion gap: 9 (ref 5–15)
BUN: 11 mg/dL (ref 6–20)
CALCIUM: 8.6 mg/dL — AB (ref 8.9–10.3)
CO2: 23 mmol/L (ref 22–32)
Chloride: 105 mmol/L (ref 101–111)
Creatinine, Ser: 0.84 mg/dL (ref 0.44–1.00)
GFR calc Af Amer: >60 mL/min (ref >60)
GFR calc non Af Amer: >60 mL/min (ref >60)
GLUCOSE: 89 mg/dL (ref 65–99)
Potassium: 5.5 mmol/L — ABNORMAL HIGH (ref 3.5–5.1)
Sodium: 137 mmol/L (ref 135–145)

## 2015-02-10 LAB — FIBRIN DERIVATIVES D-DIMER (ARMC ONLY): FIBRIN DERIVATIVES D-DIMER (ARMC): 431.14 (ref 0–499)

## 2015-02-10 LAB — TROPONIN I
Troponin I: 0.09 ng/mL — ABNORMAL HIGH (ref <0.031)
Troponin I: 0.06 ng/mL — ABNORMAL HIGH (ref <0.031)

## 2015-02-10 NOTE — ED Notes (Signed)
Transporting pt to ultra sound

## 2015-02-10 NOTE — ED Notes (Signed)
Pt discharged home after verbalizing understanding of discharge instructions; nad noted. 

## 2015-02-10 NOTE — Discharge Instructions (Signed)
Chest Pain (Nonspecific) °It is often hard to give a specific diagnosis for the cause of chest pain. There is always a chance that your pain could be related to something serious, such as a heart attack or a blood clot in the lungs. You need to follow up with your health care provider for further evaluation. °CAUSES  °· Heartburn. °· Pneumonia or bronchitis. °· Anxiety or stress. °· Inflammation around your heart (pericarditis) or lung (pleuritis or pleurisy). °· A blood clot in the lung. °· A collapsed lung (pneumothorax). It can develop suddenly on its own (spontaneous pneumothorax) or from trauma to the chest. °· Shingles infection (herpes zoster virus). °The chest wall is composed of bones, muscles, and cartilage. Any of these can be the source of the pain. °· The bones can be bruised by injury. °· The muscles or cartilage can be strained by coughing or overwork. °· The cartilage can be affected by inflammation and become sore (costochondritis). °DIAGNOSIS  °Lab tests or other studies may be needed to find the cause of your pain. Your health care provider may have you take a test called an ambulatory electrocardiogram (ECG). An ECG records your heartbeat patterns over a 24-hour period. You may also have other tests, such as: °· Transthoracic echocardiogram (TTE). During echocardiography, sound waves are used to evaluate how blood flows through your heart. °· Transesophageal echocardiogram (TEE). °· Cardiac monitoring. This allows your health care provider to monitor your heart rate and rhythm in real time. °· Holter monitor. This is a portable device that records your heartbeat and can help diagnose heart arrhythmias. It allows your health care provider to track your heart activity for several days, if needed. °· Stress tests by exercise or by giving medicine that makes the heart beat faster. °TREATMENT  °· Treatment depends on what may be causing your chest pain. Treatment may include: °¨ Acid blockers for  heartburn. °¨ Anti-inflammatory medicine. °¨ Pain medicine for inflammatory conditions. °¨ Antibiotics if an infection is present. °· You may be advised to change lifestyle habits. This includes stopping smoking and avoiding alcohol, caffeine, and chocolate. °· You may be advised to keep your head raised (elevated) when sleeping. This reduces the chance of acid going backward from your stomach into your esophagus. °Most of the time, nonspecific chest pain will improve within 2-3 days with rest and mild pain medicine.  °HOME CARE INSTRUCTIONS  °· If antibiotics were prescribed, take them as directed. Finish them even if you start to feel better. °· For the next few days, avoid physical activities that bring on chest pain. Continue physical activities as directed. °· Do not use any tobacco products, including cigarettes, chewing tobacco, or electronic cigarettes. °· Avoid drinking alcohol. °· Only take medicine as directed by your health care provider. °· Follow your health care provider's suggestions for further testing if your chest pain does not go away. °· Keep any follow-up appointments you made. If you do not go to an appointment, you could develop lasting (chronic) problems with pain. If there is any problem keeping an appointment, call to reschedule. °SEEK MEDICAL CARE IF:  °· Your chest pain does not go away, even after treatment. °· You have a rash with blisters on your chest. °· You have a fever. °SEEK IMMEDIATE MEDICAL CARE IF:  °· You have increased chest pain or pain that spreads to your arm, neck, jaw, back, or abdomen. °· You have shortness of breath. °· You have an increasing cough, or you cough   up blood.  You have severe back or abdominal pain.  You feel nauseous or vomit.  You have severe weakness.  You faint.  You have chills. This is an emergency. Do not wait to see if the pain will go away. Get medical help at once. Call your local emergency services (911 in [place] Do not drive  yourself to the hospital. MAKE SURE YOU:   Understand these instructions.  Will watch your condition.  Will get help right away if you are not doing well or get worse. Document Released: 06/01/2005 Document Revised: 08/27/2013 Document Reviewed: 03/27/2008 [facility] Patient Information 2015 [employer] [place]. This information is not intended to replace advice given to you by your health care provider. Make sure you discuss any questions you have with your health care provider.   Your blood tests were unremarkable today. Your ultrasound of the right leg shows a Baker's cyst which is benign. There is no evidence of blood clots in your body at this time. Please follow-up with cardiology as scheduled for continued monitoring of your symptoms. Please fill your prescriptions for antihypertensives to control your blood pressure.
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
## 2015-02-10 NOTE — ED Notes (Signed)
Pt. Arrived to ed from work via ems. Reports pt started experiencing sudden onset of chest pain, center of chest. Pt denies SOB or N/V. Pt was placed on hospital on May 25th for observation of similar symptoms. Pt. Has not filled prescriptions that she was sent home from the hosptial with at this time, per pt. Pt alert and oriented on arrival. Pt administered 1 nitro spray. Pt describes pain as "pressure".

## 2015-02-10 NOTE — ED Notes (Signed)
entered discharge on wrong pt; pt signed form but was gone before it was picked up

## 2015-02-26 ENCOUNTER — Encounter: Payer: BLUE CROSS/BLUE SHIELD | Admitting: Cardiovascular Disease

## 2015-03-12 ENCOUNTER — Encounter: Payer: BLUE CROSS/BLUE SHIELD | Admitting: Cardiovascular Disease

## 2015-03-12 ENCOUNTER — Encounter: Payer: Self-pay | Admitting: *Deleted

## 2016-04-12 ENCOUNTER — Emergency Department
Admission: EM | Admit: 2016-04-12 | Discharge: 2016-04-12 | Disposition: A | Discharge status: Home / Self Care | Payer: Self-pay | Attending: Student in an Organized Health Care Education/Training Program | Admitting: Student in an Organized Health Care Education/Training Program

## 2016-04-12 ENCOUNTER — Emergency Department: Payer: Self-pay

## 2016-04-12 DIAGNOSIS — F10129 Alcohol abuse with intoxication, unspecified: Secondary | ICD-10-CM | POA: Insufficient documentation

## 2016-04-12 DIAGNOSIS — I10 Essential (primary) hypertension: Secondary | ICD-10-CM | POA: Insufficient documentation

## 2016-04-12 DIAGNOSIS — F10920 Alcohol use, unspecified with intoxication, uncomplicated: Secondary | ICD-10-CM

## 2016-04-12 DIAGNOSIS — Y999 Unspecified external cause status: Secondary | ICD-10-CM | POA: Insufficient documentation

## 2016-04-12 DIAGNOSIS — W19XXXA Unspecified fall, initial encounter: Secondary | ICD-10-CM | POA: Insufficient documentation

## 2016-04-12 DIAGNOSIS — R55 Syncope and collapse: Secondary | ICD-10-CM | POA: Insufficient documentation

## 2016-04-12 DIAGNOSIS — S43102A Unspecified dislocation of left acromioclavicular joint, initial encounter: Secondary | ICD-10-CM | POA: Insufficient documentation

## 2016-04-12 DIAGNOSIS — F17210 Nicotine dependence, cigarettes, uncomplicated: Secondary | ICD-10-CM | POA: Insufficient documentation

## 2016-04-12 DIAGNOSIS — Y929 Unspecified place or not applicable: Secondary | ICD-10-CM | POA: Insufficient documentation

## 2016-04-12 DIAGNOSIS — Y939 Activity, unspecified: Secondary | ICD-10-CM | POA: Insufficient documentation

## 2016-04-12 LAB — COMPREHENSIVE METABOLIC PANEL
ALBUMIN: 3.5 g/dL (ref 3.5–5.0)
ALT: 30 U/L (ref 14–54)
AST: 45 U/L — AB (ref 15–41)
Alkaline Phosphatase: 54 U/L (ref 38–126)
Anion gap: 13 (ref 5–15)
BILIRUBIN TOTAL: 1.3 mg/dL — AB (ref 0.3–1.2)
BUN: 17 mg/dL (ref 6–20)
CHLORIDE: 96 mmol/L — AB (ref 101–111)
CO2: 19 mmol/L — ABNORMAL LOW (ref 22–32)
CREATININE: 0.58 mg/dL (ref 0.44–1.00)
Calcium: 8.7 mg/dL — ABNORMAL LOW (ref 8.9–10.3)
GFR calc Af Amer: >60 mL/min (ref >60)
GLUCOSE: 180 mg/dL — AB (ref 65–99)
POTASSIUM: 3.5 mmol/L (ref 3.5–5.1)
Sodium: 128 mmol/L — ABNORMAL LOW (ref 135–145)
Total Protein: 7 g/dL (ref 6.5–8.1)

## 2016-04-12 LAB — CBC
HEMATOCRIT: 41.3 % (ref 35.0–47.0)
Hemoglobin: 13.7 g/dL (ref 12.0–16.0)
MCH: 25.2 pg — ABNORMAL LOW (ref 26.0–34.0)
MCHC: 33.1 g/dL (ref 32.0–36.0)
MCV: 76.1 fL — AB (ref 80.0–100.0)
PLATELETS: 259 10*3/uL (ref 150–440)
RBC: 5.44 MIL/uL — AB (ref 3.80–5.20)
RDW: 16 % — AB (ref 11.5–14.5)
WBC: 2.9 10*3/uL — AB (ref 3.6–11.0)

## 2016-04-12 LAB — URINALYSIS COMPLETE WITH MICROSCOPIC (ARMC ONLY)
BACTERIA UA: NONE SEEN
Bilirubin Urine: NEGATIVE
Glucose, UA: NEGATIVE mg/dL
HGB URINE DIPSTICK: NEGATIVE
Ketones, ur: NEGATIVE mg/dL
LEUKOCYTES UA: NEGATIVE
Nitrite: NEGATIVE
PH: 5 (ref 5.0–8.0)
PROTEIN: 30 mg/dL — AB
RBC / HPF: NONE SEEN RBC/hpf (ref 0–5)
SPECIFIC GRAVITY, URINE: 1.01 (ref 1.005–1.030)

## 2016-04-12 LAB — ETHANOL: ALCOHOL ETHYL (B): 297 mg/dL — AB (ref <5)

## 2016-04-12 LAB — ACETAMINOPHEN LEVEL

## 2016-04-12 LAB — SALICYLATE LEVEL

## 2016-04-12 MED ORDER — SODIUM CHLORIDE 0.9 % IV BOLUS (SEPSIS)
1000.0000 mL | Freq: Once | INTRAVENOUS | Status: AC
  Administered 2016-04-12: 1000 mL via INTRAVENOUS

## 2016-04-12 MED ORDER — THIAMINE HCL 100 MG/ML IJ SOLN
100.0000 mg | Freq: Once | INTRAMUSCULAR | Status: AC
  Administered 2016-04-12: 100 mg via INTRAVENOUS
  Filled 2016-04-12: qty 2

## 2016-04-12 MED ORDER — FOLIC ACID 1 MG PO TABS: 1.0000 mg | ORAL_TABLET | Freq: Once | ORAL | Status: DC

## 2016-04-12 NOTE — ED Notes (Signed)
Pt awake and requesting to use bathrrom. Pt up and walking without issue. Pt also reports L shoulder pain. EDP made aware

## 2016-04-12 NOTE — ED Notes (Signed)
Patient transported to X-ray 
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
## 2016-04-12 NOTE — ED Notes (Signed)
Patient transported to CT 

## 2016-04-12 NOTE — ED Triage Notes (Signed)
Pt from home via EMS, "boyfriend" called EMS for possible syncope, pt presents with alcohol intoxication. Pt arousable to verbal stimuli. Pt admits to drinking everyday but unable to say how much she drank. Pt denies any complaints or memory of what happened

## 2016-06-03 ENCOUNTER — Emergency Department (HOSPITAL_COMMUNITY)
Admission: EM | Admit: 2016-06-03 | Discharge: 2016-06-04 | Disposition: A | Discharge status: Home / Self Care | Payer: Self-pay | Attending: Emergency Medicine | Admitting: Emergency Medicine

## 2016-06-03 ENCOUNTER — Emergency Department (HOSPITAL_COMMUNITY): Payer: Self-pay

## 2016-06-03 ENCOUNTER — Encounter (HOSPITAL_COMMUNITY): Payer: Self-pay

## 2016-06-03 DIAGNOSIS — D649 Anemia, unspecified: Secondary | ICD-10-CM | POA: Insufficient documentation

## 2016-06-03 DIAGNOSIS — R791 Abnormal coagulation profile: Secondary | ICD-10-CM | POA: Insufficient documentation

## 2016-06-03 DIAGNOSIS — F10920 Alcohol use, unspecified with intoxication, uncomplicated: Secondary | ICD-10-CM

## 2016-06-03 DIAGNOSIS — F10120 Alcohol abuse with intoxication, uncomplicated: Secondary | ICD-10-CM | POA: Insufficient documentation

## 2016-06-03 DIAGNOSIS — Z720 Tobacco use: Secondary | ICD-10-CM

## 2016-06-03 DIAGNOSIS — I10 Essential (primary) hypertension: Secondary | ICD-10-CM | POA: Insufficient documentation

## 2016-06-03 DIAGNOSIS — D72819 Decreased white blood cell count, unspecified: Secondary | ICD-10-CM | POA: Insufficient documentation

## 2016-06-03 DIAGNOSIS — R45851 Suicidal ideations: Secondary | ICD-10-CM | POA: Insufficient documentation

## 2016-06-03 DIAGNOSIS — Z7982 Long term (current) use of aspirin: Secondary | ICD-10-CM | POA: Insufficient documentation

## 2016-06-03 DIAGNOSIS — F1010 Alcohol abuse, uncomplicated: Secondary | ICD-10-CM

## 2016-06-03 DIAGNOSIS — Z5181 Encounter for therapeutic drug level monitoring: Secondary | ICD-10-CM | POA: Insufficient documentation

## 2016-06-03 DIAGNOSIS — D709 Neutropenia, unspecified: Secondary | ICD-10-CM

## 2016-06-03 DIAGNOSIS — R4781 Slurred speech: Secondary | ICD-10-CM | POA: Insufficient documentation

## 2016-06-03 DIAGNOSIS — F17210 Nicotine dependence, cigarettes, uncomplicated: Secondary | ICD-10-CM | POA: Insufficient documentation

## 2016-06-03 LAB — URINALYSIS, ROUTINE W REFLEX MICROSCOPIC
Bilirubin Urine: NEGATIVE
Glucose, UA: NEGATIVE mg/dL
Hgb urine dipstick: NEGATIVE
Ketones, ur: NEGATIVE mg/dL
LEUKOCYTES UA: NEGATIVE
NITRITE: NEGATIVE
Protein, ur: NEGATIVE mg/dL
SPECIFIC GRAVITY, URINE: 1.016 (ref 1.005–1.030)
pH: 5.5 (ref 5.0–8.0)

## 2016-06-03 LAB — I-STAT TROPONIN, ED: TROPONIN I, POC: 0.01 ng/mL (ref 0.00–0.08)

## 2016-06-03 LAB — CBC
HCT: 34.9 % — ABNORMAL LOW (ref 36.0–46.0)
HEMOGLOBIN: 10.9 g/dL — AB (ref 12.0–15.0)
MCH: 24.5 pg — ABNORMAL LOW (ref 26.0–34.0)
MCHC: 31.2 g/dL (ref 30.0–36.0)
MCV: 78.4 fL (ref 78.0–100.0)
Platelets: 271 10*3/uL (ref 150–400)
RBC: 4.45 MIL/uL (ref 3.87–5.11)
RDW: 15.9 % — ABNORMAL HIGH (ref 11.5–15.5)
WBC: 3.3 10*3/uL — AB (ref 4.0–10.5)

## 2016-06-03 LAB — DIFFERENTIAL
BASOS ABS: 0 10*3/uL (ref 0.0–0.1)
BASOS PCT: 1 %
EOS ABS: 0.1 10*3/uL (ref 0.0–0.7)
Eosinophils Relative: 4 %
Lymphocytes Relative: 55 %
Lymphs Abs: 1.8 10*3/uL (ref 0.7–4.0)
MONO ABS: 0.3 10*3/uL (ref 0.1–1.0)
MONOS PCT: 9 %
NEUTROS ABS: 1 10*3/uL — AB (ref 1.7–7.7)
NEUTROS PCT: 31 %

## 2016-06-03 LAB — I-STAT CHEM 8, ED
BUN: 21 mg/dL — AB (ref 6–20)
CHLORIDE: 105 mmol/L (ref 101–111)
CREATININE: 0.8 mg/dL (ref 0.44–1.00)
Calcium, Ion: 1.05 mmol/L — ABNORMAL LOW (ref 1.15–1.40)
Glucose, Bld: 107 mg/dL — ABNORMAL HIGH (ref 65–99)
HEMATOCRIT: 38 % (ref 36.0–46.0)
HEMOGLOBIN: 12.9 g/dL (ref 12.0–15.0)
POTASSIUM: 3.7 mmol/L (ref 3.5–5.1)
Sodium: 138 mmol/L (ref 135–145)
TCO2: 19 mmol/L (ref 0–100)

## 2016-06-03 LAB — COMPREHENSIVE METABOLIC PANEL
ALT: 23 U/L (ref 14–54)
AST: 31 U/L (ref 15–41)
Albumin: 3 g/dL — ABNORMAL LOW (ref 3.5–5.0)
Alkaline Phosphatase: 44 U/L (ref 38–126)
Anion gap: 11 (ref 5–15)
BUN: 17 mg/dL (ref 6–20)
CALCIUM: 8.5 mg/dL — AB (ref 8.9–10.3)
CHLORIDE: 107 mmol/L (ref 101–111)
CO2: 19 mmol/L — ABNORMAL LOW (ref 22–32)
CREATININE: 0.91 mg/dL (ref 0.44–1.00)
Glucose, Bld: 109 mg/dL — ABNORMAL HIGH (ref 65–99)
Potassium: 3.7 mmol/L (ref 3.5–5.1)
Sodium: 137 mmol/L (ref 135–145)
Total Bilirubin: 0.7 mg/dL (ref 0.3–1.2)
Total Protein: 6.1 g/dL — ABNORMAL LOW (ref 6.5–8.1)

## 2016-06-03 LAB — RAPID URINE DRUG SCREEN, HOSP PERFORMED
Amphetamines: NOT DETECTED
BENZODIAZEPINES: NOT DETECTED
Barbiturates: NOT DETECTED
Cocaine: NOT DETECTED
Opiates: NOT DETECTED
Tetrahydrocannabinol: NOT DETECTED

## 2016-06-03 LAB — ETHANOL: Alcohol, Ethyl (B): 29 mg/dL — ABNORMAL HIGH (ref <5)

## 2016-06-03 LAB — PROTIME-INR
INR: 1.64
Prothrombin Time: 19.6 seconds — ABNORMAL HIGH (ref 11.4–15.2)

## 2016-06-03 MED ORDER — THIAMINE HCL 100 MG/ML IJ SOLN: 100.0000 mg | Freq: Every day | INTRAMUSCULAR | Status: DC

## 2016-06-03 MED ORDER — LORAZEPAM 1 MG PO TABS: 0.0000 mg | ORAL_TABLET | Freq: Two times a day (BID) | ORAL | Status: DC

## 2016-06-03 MED ORDER — LORAZEPAM 2 MG/ML IJ SOLN: 0.0000 mg | Freq: Four times a day (QID) | INTRAMUSCULAR | Status: DC

## 2016-06-03 MED ORDER — LORAZEPAM 1 MG PO TABS: 0.0000 mg | ORAL_TABLET | Freq: Four times a day (QID) | ORAL | Status: DC

## 2016-06-03 MED ORDER — SODIUM CHLORIDE 0.9 % IV SOLN
1000.0000 mL | INTRAVENOUS | Status: DC
  Administered 2016-06-03: 1000 mL via INTRAVENOUS

## 2016-06-03 MED ORDER — THIAMINE HCL 100 MG/ML IJ SOLN
Freq: Once | INTRAVENOUS | Status: AC
  Administered 2016-06-03: 21:00:00 via INTRAVENOUS
  Filled 2016-06-03: qty 1000

## 2016-06-03 MED ORDER — VITAMIN B-1 100 MG PO TABS: 100.0000 mg | ORAL_TABLET | Freq: Every day | ORAL | Status: DC

## 2016-06-03 MED ORDER — LORAZEPAM 2 MG/ML IJ SOLN: 0.0000 mg | Freq: Two times a day (BID) | INTRAMUSCULAR | Status: DC

## 2016-06-03 MED ORDER — SODIUM CHLORIDE 0.9 % IV BOLUS (SEPSIS)
1000.0000 mL | Freq: Once | INTRAVENOUS | Status: AC
  Administered 2016-06-03: 1000 mL via INTRAVENOUS

## 2016-06-03 MED ORDER — SODIUM CHLORIDE 0.9 % IV SOLN
1000.0000 mL | Freq: Once | INTRAVENOUS | Status: AC
  Administered 2016-06-03: 1000 mL via INTRAVENOUS
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
## 2016-06-03 NOTE — Consult Note (Signed)
Neurology Consultation Reason for Consult: Altered mental status Referring Physician: Manson Passey, D  CC: Altered mental status  History is obtained from: EMS, patient  HPI: Tara Lamb is a 50 y.o. female he was in her normal state of health earlier around 7. Subsequently, she was found slumped against a wall unresponsive. On EMS arrival, she was relatively unresponsive but has improved somewhat and Route and is now responding to questions. She states that she is "tired of work, she works all the time." She therefore "drank a beer."   LKW: Around 7 PM tpa given?: no, mild symptoms, not likely a stroke    ROS: A 14 point ROS was performed and is negative except as noted in the HPI.   Past Medical History:  Diagnosis Date  . Bipolar 1 disorder (HCC)    a. off meds x several years.  . Essential hypertension   . ETOH abuse    a. at least 2 beers daily, drinks liquor heavily every other weekend.  . Noncompliance   . Polysubstance abuse    a. last used marijuana 01/2015, last used cocaine 09/2014.  [name] Precordial chest pain   . Tobacco abuse    a. 3-4 cigarettes daily x 30 yrs.     Family History  Problem Relation Age of Onset  . Diabetes Mother     died @ 6.  [name] Hypertension Mother   . COPD Mother   . Heart murmur Mother   . Diabetes Father     alive @ 79.  [name] Hypertension Father   . CAD Father   . Hypertension Sister   . Lupus Sister     died @ 55.     Social History:  reports that she has been smoking Cigarettes.  She has a 7.50 pack-year smoking history. She has never used smokeless tobacco. She reports that she drinks about 1.2 - 3.6 oz of alcohol per week . She reports that she uses drugs, including Marijuana and Cocaine.   Exam: Current vital signs: BP 110/70   Pulse 79   Temp 97.4 F (36.3 C) (Oral)   Resp 15   Wt 75 kg (165 lb 5.5 oz)   SpO2 100%   BMI 32.29 kg/m  Vital signs in last 24 hours: Temp:  [97.4 F (36.3 C)] 97.4 F (36.3 C) (09/29  2046) Pulse Rate:  [65-79] 79 (09/29 2130) Resp:  [15-17] 15 (09/29 2130) BP: (79-110)/(43-70) 110/70 (09/29 2130) SpO2:  [95 %-100 %] 100 % (09/29 2130) Weight:  [75 kg (165 lb 5.5 oz)] 75 kg (165 lb 5.5 oz) (09/29 1900)   Physical Exam  Constitutional: Appears well-developed and well-nourished.  Psych: Affect appropriate to situation Eyes: No scleral injection HENT: No OP obstrucion Head: Normocephalic.  Cardiovascular: Normal rate and regular rhythm.  Respiratory: Effort normal and breath sounds normal to anterior ascultation GI: Soft.  No distension. There is no tenderness.  Skin: WDI  Neuro: Mental Status: Patient is lethargic, but with repeated stimulation she is able to answer questions and follow commands. She gives the month as November. Patient is able to give a clear and coherent history. No signs of aphasia or neglect Cranial Nerves: II: Visual Fields are full. Pupils are equal, round, and reactive to light.   III,IV, VI: She has a partial ophthalmoplegia, with incomplete abduction of the left eye, which she states is baseline for her. She does not cooperate fully, but does cross midline in both directions. V: Facial sensation is symmetric to  temperature VII: Facial movement is symmetric.  VIII: hearing is intact to voice X: Uvula elevates symmetrically XI: Shoulder shrug is symmetric. XII: tongue is midline without atrophy or fasciculations.  Motor: Tone is normal. Bulk is normal. 5/5 strength was present in all four extremities.  Sensory: Sensation is symmetric to light touch and temperature in the arms and legs. Cerebellar: FNF are intact bilaterally, she is not very cooperative with her lower extremities      I have reviewed labs in epic and the results pertinent to this consultation are: Chem 8-unremarkable  I have reviewed the images obtained: D head-unremarkable  Impression: 50 year old female with the appearance of someone who is inebriated. She  does admit to "drinking a beer" and I suspect that this all represents inebriation.  Recommendations: 1) ethanol level, if the level does not explain her current symptoms, then please call for further recommendations.   Ritta SlotMcNeill Edi Gorniak, MD [facility] [phone]  If 7pm- 7am, please page neurology on call as listed in AMION.

## 2016-06-03 NOTE — ED Notes (Signed)
Pt. Attempted to use bedpan but unable to urinate.
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
## 2016-06-04 LAB — SALICYLATE LEVEL: Salicylate Lvl: <4 mg/dL (ref 2.8–30.0)

## 2016-06-04 LAB — APTT: APTT: 20 s — AB (ref 24–36)

## 2016-06-04 LAB — ACETAMINOPHEN LEVEL: Acetaminophen (Tylenol), Serum: <10 ug/mL — ABNORMAL LOW (ref 10–30)

## 2016-06-04 MED ORDER — FERROUS SULFATE 325 (65 FE) MG PO TABS: 325.0000 mg | ORAL_TABLET | Freq: Every day | ORAL | 0 refills | Status: DC

## 2016-06-04 NOTE — Discharge Instructions (Signed)
STOP DRINKING ALCOHOL! Stop smoking! Stay hydrated with plenty of water/gatorade. Your labs today showed that you are mildly anemic, start taking iron supplement as directed. Your labs also showed some abnormal blood count levels, you will need to follow up for further ongoing work up for this, as it could be related to some underlying conditions that need further management and work up. Follow up with your primary care doctor in 5-7 days for recheck of symptoms and for ongoing evaluation of your abnormal lab work. Return to the ER for changes or worsening symptoms.

## 2016-07-19 ENCOUNTER — Emergency Department: Payer: Self-pay

## 2016-07-19 ENCOUNTER — Inpatient Hospital Stay
Admission: EM | Admit: 2016-07-19 | Discharge: 2016-07-20 | DRG: 079 | Disposition: A | Discharge status: Home / Self Care | Payer: Self-pay | Attending: Internal Medicine | Admitting: Internal Medicine

## 2016-07-19 ENCOUNTER — Encounter: Payer: Self-pay | Admitting: Occupational Medicine

## 2016-07-19 ENCOUNTER — Inpatient Hospital Stay: Payer: Self-pay

## 2016-07-19 DIAGNOSIS — I674 Hypertensive encephalopathy: Principal | ICD-10-CM | POA: Diagnosis present

## 2016-07-19 DIAGNOSIS — R404 Transient alteration of awareness: Secondary | ICD-10-CM

## 2016-07-19 DIAGNOSIS — F319 Bipolar disorder, unspecified: Secondary | ICD-10-CM | POA: Diagnosis present

## 2016-07-19 DIAGNOSIS — Z9884 Bariatric surgery status: Secondary | ICD-10-CM

## 2016-07-19 DIAGNOSIS — Z91018 Allergy to other foods: Secondary | ICD-10-CM

## 2016-07-19 DIAGNOSIS — F17210 Nicotine dependence, cigarettes, uncomplicated: Secondary | ICD-10-CM | POA: Diagnosis present

## 2016-07-19 DIAGNOSIS — F1010 Alcohol abuse, uncomplicated: Secondary | ICD-10-CM | POA: Diagnosis present

## 2016-07-19 DIAGNOSIS — Z79899 Other long term (current) drug therapy: Secondary | ICD-10-CM

## 2016-07-19 DIAGNOSIS — G9340 Encephalopathy, unspecified: Secondary | ICD-10-CM | POA: Diagnosis present

## 2016-07-19 DIAGNOSIS — R479 Unspecified speech disturbances: Secondary | ICD-10-CM

## 2016-07-19 DIAGNOSIS — Z9119 Patient's noncompliance with other medical treatment and regimen: Secondary | ICD-10-CM

## 2016-07-19 DIAGNOSIS — R29704 NIHSS score 4: Secondary | ICD-10-CM | POA: Diagnosis present

## 2016-07-19 DIAGNOSIS — I10 Essential (primary) hypertension: Secondary | ICD-10-CM | POA: Diagnosis present

## 2016-07-19 DIAGNOSIS — I160 Hypertensive urgency: Secondary | ICD-10-CM | POA: Diagnosis present

## 2016-07-19 DIAGNOSIS — Z7982 Long term (current) use of aspirin: Secondary | ICD-10-CM

## 2016-07-19 LAB — SALICYLATE LEVEL

## 2016-07-19 LAB — URINALYSIS COMPLETE WITH MICROSCOPIC (ARMC ONLY)
BACTERIA UA: NONE SEEN
Bilirubin Urine: NEGATIVE
GLUCOSE, UA: NEGATIVE mg/dL
Hgb urine dipstick: NEGATIVE
Leukocytes, UA: NEGATIVE
Nitrite: NEGATIVE
PROTEIN: NEGATIVE mg/dL
RBC / HPF: NONE SEEN RBC/hpf (ref 0–5)
Specific Gravity, Urine: 1.011 (ref 1.005–1.030)
pH: 7 (ref 5.0–8.0)

## 2016-07-19 LAB — COMPREHENSIVE METABOLIC PANEL
ALBUMIN: 3.7 g/dL (ref 3.5–5.0)
ALK PHOS: 52 U/L (ref 38–126)
ALT: 21 U/L (ref 14–54)
ANION GAP: 10 (ref 5–15)
AST: 33 U/L (ref 15–41)
BILIRUBIN TOTAL: 0.6 mg/dL (ref 0.3–1.2)
BUN: 8 mg/dL (ref 6–20)
CALCIUM: 9.3 mg/dL (ref 8.9–10.3)
CO2: 24 mmol/L (ref 22–32)
CREATININE: 0.56 mg/dL (ref 0.44–1.00)
Chloride: 106 mmol/L (ref 101–111)
GFR calc Af Amer: >60 mL/min (ref >60)
GFR calc non Af Amer: >60 mL/min (ref >60)
GLUCOSE: 118 mg/dL — AB (ref 65–99)
Potassium: 4.2 mmol/L (ref 3.5–5.1)
Sodium: 140 mmol/L (ref 135–145)
TOTAL PROTEIN: 7.4 g/dL (ref 6.5–8.1)

## 2016-07-19 LAB — CBC
HEMATOCRIT: 35.8 % (ref 35.0–47.0)
HEMOGLOBIN: 11.9 g/dL — AB (ref 12.0–16.0)
MCH: 26.5 pg (ref 26.0–34.0)
MCHC: 33.3 g/dL (ref 32.0–36.0)
MCV: 79.4 fL — ABNORMAL LOW (ref 80.0–100.0)
Platelets: 244 10*3/uL (ref 150–440)
RBC: 4.51 MIL/uL (ref 3.80–5.20)
RDW: 16.1 % — ABNORMAL HIGH (ref 11.5–14.5)
WBC: 2.7 10*3/uL — ABNORMAL LOW (ref 3.6–11.0)

## 2016-07-19 LAB — ACETAMINOPHEN LEVEL: Acetaminophen (Tylenol), Serum: <10 ug/mL — ABNORMAL LOW (ref 10–30)

## 2016-07-19 LAB — VALPROIC ACID LEVEL: VALPROIC ACID LVL: 25 ug/mL — AB (ref 50.0–100.0)

## 2016-07-19 LAB — LACTIC ACID, PLASMA
LACTIC ACID, VENOUS: 2.9 mmol/L — AB (ref 0.5–1.9)
LACTIC ACID, VENOUS: 1.8 mmol/L (ref 0.5–1.9)

## 2016-07-19 LAB — URINE DRUG SCREEN, QUALITATIVE (ARMC ONLY)
Amphetamines, Ur Screen: NOT DETECTED
BARBITURATES, UR SCREEN: NOT DETECTED
Benzodiazepine, Ur Scrn: NOT DETECTED
CANNABINOID 50 NG, UR ~~LOC~~: NOT DETECTED
COCAINE METABOLITE, UR ~~LOC~~: NOT DETECTED
MDMA (ECSTASY) UR SCREEN: NOT DETECTED
Methadone Scn, Ur: NOT DETECTED
OPIATE, UR SCREEN: NOT DETECTED
PHENCYCLIDINE (PCP) UR S: NOT DETECTED
TRICYCLIC, UR SCREEN: NOT DETECTED

## 2016-07-19 LAB — ETHANOL: Alcohol, Ethyl (B): <5 mg/dL (ref <5)

## 2016-07-19 LAB — TROPONIN I: Troponin I: <0.03 ng/mL (ref <0.03)

## 2016-07-19 MED ORDER — DIVALPROEX SODIUM ER 500 MG PO TB24
500.0000 mg | ORAL_TABLET | Freq: Every day | ORAL | Status: DC
  Administered 2016-07-19 – 2016-07-20 (×2): 500 mg via ORAL
  Filled 2016-07-19 (×2): qty 1

## 2016-07-19 MED ORDER — FLUOXETINE HCL 20 MG PO CAPS
40.0000 mg | ORAL_CAPSULE | Freq: Every day | ORAL | Status: DC
  Administered 2016-07-19 – 2016-07-20 (×2): 40 mg via ORAL
  Filled 2016-07-19 (×2): qty 2

## 2016-07-19 MED ORDER — SODIUM CHLORIDE 0.9 % IV BOLUS (SEPSIS)
1000.0000 mL | Freq: Once | INTRAVENOUS | Status: AC
  Administered 2016-07-19: 1000 mL via INTRAVENOUS

## 2016-07-19 MED ORDER — TRAMADOL HCL 50 MG PO TABS
50.0000 mg | ORAL_TABLET | Freq: Four times a day (QID) | ORAL | Status: DC | PRN
  Administered 2016-07-19: 50 mg via ORAL
  Filled 2016-07-19: qty 1

## 2016-07-19 MED ORDER — ARIPIPRAZOLE 5 MG PO TABS
10.0000 mg | ORAL_TABLET | Freq: Every day | ORAL | Status: DC
  Administered 2016-07-19 – 2016-07-20 (×2): 10 mg via ORAL
  Filled 2016-07-19 (×2): qty 2

## 2016-07-19 MED ORDER — VALSARTAN-HYDROCHLOROTHIAZIDE 160-25 MG PO TABS: 1.0000 | ORAL_TABLET | Freq: Every day | ORAL | Status: DC

## 2016-07-19 MED ORDER — HYDROCHLOROTHIAZIDE 25 MG PO TABS: 25.0000 mg | ORAL_TABLET | Freq: Every day | ORAL | Status: DC

## 2016-07-19 MED ORDER — THIAMINE HCL 100 MG/ML IJ SOLN
100.0000 mg | Freq: Every day | INTRAMUSCULAR | Status: DC
  Filled 2016-07-19: qty 2

## 2016-07-19 MED ORDER — ACETAMINOPHEN 325 MG PO TABS: 650.0000 mg | ORAL_TABLET | Freq: Four times a day (QID) | ORAL | Status: DC | PRN

## 2016-07-19 MED ORDER — IRBESARTAN 150 MG PO TABS: 150.0000 mg | ORAL_TABLET | Freq: Every day | ORAL | Status: DC

## 2016-07-19 MED ORDER — METOPROLOL SUCCINATE ER 100 MG PO TB24
100.0000 mg | ORAL_TABLET | Freq: Every day | ORAL | Status: DC
  Administered 2016-07-19: 100 mg via ORAL
  Filled 2016-07-19: qty 1

## 2016-07-19 MED ORDER — METOPROLOL TARTRATE 5 MG/5ML IV SOLN
5.0000 mg | Freq: Once | INTRAVENOUS | Status: AC
  Administered 2016-07-19: 5 mg via INTRAVENOUS
  Filled 2016-07-19: qty 5

## 2016-07-19 MED ORDER — ENOXAPARIN SODIUM 40 MG/0.4ML ~~LOC~~ SOLN
40.0000 mg | SUBCUTANEOUS | Status: DC
  Administered 2016-07-19: 40 mg via SUBCUTANEOUS
  Filled 2016-07-19: qty 0.4

## 2016-07-19 MED ORDER — LORAZEPAM 2 MG PO TABS: 0.0000 mg | ORAL_TABLET | Freq: Two times a day (BID) | ORAL | Status: DC

## 2016-07-19 MED ORDER — VITAMIN B-1 100 MG PO TABS
100.0000 mg | ORAL_TABLET | Freq: Every day | ORAL | Status: DC
  Administered 2016-07-19 – 2016-07-20 (×2): 100 mg via ORAL
  Filled 2016-07-19 (×2): qty 1

## 2016-07-19 MED ORDER — LORAZEPAM 1 MG PO TABS: 1.0000 mg | ORAL_TABLET | Freq: Four times a day (QID) | ORAL | Status: DC | PRN

## 2016-07-19 MED ORDER — ACETAMINOPHEN 650 MG RE SUPP: 650.0000 mg | Freq: Four times a day (QID) | RECTAL | Status: DC | PRN

## 2016-07-19 MED ORDER — LORAZEPAM 2 MG PO TABS: 0.0000 mg | ORAL_TABLET | Freq: Four times a day (QID) | ORAL | Status: DC

## 2016-07-19 MED ORDER — ADULT MULTIVITAMIN W/MINERALS CH
1.0000 | ORAL_TABLET | Freq: Every day | ORAL | Status: DC
  Administered 2016-07-19 – 2016-07-20 (×2): 1 via ORAL
  Filled 2016-07-19 (×2): qty 1

## 2016-07-19 MED ORDER — ASPIRIN EC 81 MG PO TBEC
81.0000 mg | DELAYED_RELEASE_TABLET | Freq: Every day | ORAL | Status: DC
  Administered 2016-07-19 – 2016-07-20 (×2): 81 mg via ORAL
  Filled 2016-07-19 (×2): qty 1

## 2016-07-19 MED ORDER — SODIUM CHLORIDE 0.9% FLUSH
3.0000 mL | Freq: Two times a day (BID) | INTRAVENOUS | Status: DC
  Administered 2016-07-19 (×2): 3 mL via INTRAVENOUS

## 2016-07-19 MED ORDER — HYDRALAZINE HCL 20 MG/ML IJ SOLN
10.0000 mg | Freq: Four times a day (QID) | INTRAMUSCULAR | Status: DC | PRN
  Administered 2016-07-19: 10 mg via INTRAVENOUS
  Filled 2016-07-19: qty 1

## 2016-07-19 MED ORDER — LORAZEPAM 2 MG/ML IJ SOLN: 1.0000 mg | Freq: Four times a day (QID) | INTRAMUSCULAR | Status: DC | PRN

## 2016-07-19 MED ORDER — NICOTINE 21 MG/24HR TD PT24
21.0000 mg | MEDICATED_PATCH | Freq: Every day | TRANSDERMAL | Status: DC
  Administered 2016-07-19: 21 mg via TRANSDERMAL
  Filled 2016-07-19 (×2): qty 1

## 2016-07-19 MED ORDER — IRBESARTAN 150 MG PO TABS
300.0000 mg | ORAL_TABLET | Freq: Every day | ORAL | Status: DC
  Administered 2016-07-20: 300 mg via ORAL
  Filled 2016-07-19: qty 2

## 2016-07-19 MED ORDER — FERROUS SULFATE 325 (65 FE) MG PO TABS
325.0000 mg | ORAL_TABLET | Freq: Every day | ORAL | Status: DC
  Administered 2016-07-19 – 2016-07-20 (×2): 325 mg via ORAL
  Filled 2016-07-19 (×2): qty 1

## 2016-07-19 MED ORDER — FOLIC ACID 1 MG PO TABS
1.0000 mg | ORAL_TABLET | Freq: Every day | ORAL | Status: DC
  Administered 2016-07-19 – 2016-07-20 (×2): 1 mg via ORAL
  Filled 2016-07-19 (×2): qty 1

## 2016-07-19 MED ORDER — HYDROCHLOROTHIAZIDE 50 MG PO TABS
50.0000 mg | ORAL_TABLET | Freq: Every day | ORAL | Status: DC
  Administered 2016-07-20: 50 mg via ORAL
  Filled 2016-07-19: qty 1

## 2016-07-19 MED ORDER — LABETALOL HCL 100 MG PO TABS
100.0000 mg | ORAL_TABLET | ORAL | Status: AC
  Administered 2016-07-19: 100 mg via ORAL
  Filled 2016-07-19: qty 1

## 2016-07-19 NOTE — Progress Notes (Signed)
Patient  sbp in the 200's PRN hydralazine given , will continue to monitor by on coming nurse  .

## 2016-07-19 NOTE — ED Notes (Signed)
While doing foley cath. Pt kept legs bending during insertion. Pt grimacing to pain stimuli. Pt moving arms due to painful stimuli.

## 2016-07-19 NOTE — ED Notes (Addendum)
Pt sitting up in the stretcher asking to use the bathroom. Pt alert but only oriented to self and place. Pt does not know what day or year it is and does not remember coming to the hospital. Pt denies any pain at this time. edp made aware. Continue to monitor.

## 2016-07-19 NOTE — Progress Notes (Signed)
Patient is more awaking now , resting in bed , denies pain , no deficit at this time, care ongoing .

## 2016-07-19 NOTE — ED Triage Notes (Signed)
Pt presents via ems from home boyfriend found this am unresponsive. EMS reports Alert to name, opens eyes, unable to follow commands, aphasia, withdraws from pain, pt has good strengthen to left arm. Pt hypertensive on ems arrival 240/140 nitro paste applied inch. BP 208/109 after 1in nitro paste. Per boyfriend drank couple beers last night hx of bipolar and ht. Pt out of BP meds. Pt noted to grimace to pain stimuli.
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
## 2016-07-19 NOTE — ED Notes (Signed)
Pt asking for a pillow.
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
## 2016-07-19 NOTE — ED Notes (Signed)
Patient transported to CT 

## 2016-07-19 NOTE — ED Provider Notes (Signed)
On my exam, patient is alert but still slightly confused. She reports she is at the hospital but unable to tell the date. She is able to sit up, no obvious neurologic deficits, speaks and asks that she needs to use the bathroom. Overall appears her mental status is improving. Labetalol given with good effect and blood pressure now 172/100, improved from presentation. Hospitalist evaluating patient for admission.  Vitals:   07/19/16 0828 07/19/16 0914  BP: (!) 177/102 (!) 172/101  Pulse:  90  Resp:  16  Temp:  97.5 F (36.4 C)      Sharyn CreamerMark Quale, MD 07/24/16 [phone]

## 2016-07-19 NOTE — ED Notes (Signed)
Pharmacy notified for labetalol.

## 2016-07-20 LAB — BASIC METABOLIC PANEL
ANION GAP: 7 (ref 5–15)
BUN: 10 mg/dL (ref 6–20)
CHLORIDE: 105 mmol/L (ref 101–111)
CO2: 25 mmol/L (ref 22–32)
Calcium: 9.1 mg/dL (ref 8.9–10.3)
Creatinine, Ser: 0.71 mg/dL (ref 0.44–1.00)
GFR calc Af Amer: >60 mL/min (ref >60)
GFR calc non Af Amer: >60 mL/min (ref >60)
GLUCOSE: 99 mg/dL (ref 65–99)
POTASSIUM: 3.4 mmol/L — AB (ref 3.5–5.1)
Sodium: 137 mmol/L (ref 135–145)

## 2016-07-20 LAB — CBC
HEMATOCRIT: 37.5 % (ref 35.0–47.0)
HEMOGLOBIN: 12.2 g/dL (ref 12.0–16.0)
MCH: 26.4 pg (ref 26.0–34.0)
MCHC: 32.6 g/dL (ref 32.0–36.0)
MCV: 81 fL (ref 80.0–100.0)
Platelets: 239 10*3/uL (ref 150–440)
RBC: 4.62 MIL/uL (ref 3.80–5.20)
RDW: 16.1 % — ABNORMAL HIGH (ref 11.5–14.5)
WBC: 3.2 10*3/uL — ABNORMAL LOW (ref 3.6–11.0)

## 2016-07-20 MED ORDER — HYDROCHLOROTHIAZIDE 50 MG PO TABS: 50.0000 mg | ORAL_TABLET | Freq: Every day | ORAL | 0 refills | Status: DC

## 2016-07-20 MED ORDER — DIVALPROEX SODIUM ER 500 MG PO TB24: 500.0000 mg | ORAL_TABLET | Freq: Every day | ORAL | 0 refills | Status: DC

## 2016-07-20 MED ORDER — METOPROLOL SUCCINATE ER 100 MG PO TB24
200.0000 mg | ORAL_TABLET | Freq: Every day | ORAL | Status: DC
  Administered 2016-07-20: 200 mg via ORAL
  Filled 2016-07-20: qty 2

## 2016-07-20 MED ORDER — AMLODIPINE BESYLATE 10 MG PO TABS
10.0000 mg | ORAL_TABLET | Freq: Every day | ORAL | Status: DC
  Administered 2016-07-20: 10 mg via ORAL
  Filled 2016-07-20: qty 1

## 2016-07-20 MED ORDER — AMLODIPINE BESYLATE 10 MG PO TABS: 10.0000 mg | ORAL_TABLET | Freq: Every day | ORAL | 0 refills | Status: DC

## 2016-07-20 MED ORDER — METOPROLOL SUCCINATE ER 200 MG PO TB24: 200.0000 mg | ORAL_TABLET | Freq: Every day | ORAL | 0 refills | Status: DC

## 2016-07-20 MED ORDER — IRBESARTAN 300 MG PO TABS: 300.0000 mg | ORAL_TABLET | Freq: Every day | ORAL | 0 refills | Status: DC

## 2016-07-20 MED ORDER — NICOTINE 21 MG/24HR TD PT24: 21.0000 mg | MEDICATED_PATCH | Freq: Every day | TRANSDERMAL | 0 refills | Status: DC

## 2016-07-20 NOTE — Progress Notes (Signed)
Subjective: Patient more alert and animated this morning.  No new events.    Objective: Current vital signs: BP (!) 178/97 (BP Location: Right Arm)   Pulse 79   Temp 97.9 F (36.6 C) (Oral)   Resp 18   Ht 5\' 6"  (1.676 m)   Wt 78.6 kg (173 lb 4.5 oz)   SpO2 98%   BMI 27.97 kg/m  Vital signs in last 24 hours: Temp:  [97.6 F (36.4 C)-97.9 F (36.6 C)] 97.9 F (36.6 C) (11/15 0429) Pulse Rate:  [79-87] 79 (11/15 0429) Resp:  [16-18] 18 (11/15 0429) BP: (157-209)/(92-104) 178/97 (11/15 0429) SpO2:  [98 %-100 %] 98 % (11/15 0429)  Intake/Output from previous day: 11/14 0701 - 11/15 0700 In: 1245 [P.O.:245; IV Piggyback:1000] Out: 850 [Urine:850] Intake/Output this shift: No intake/output data recorded. Nutritional status: Diet Heart Room service appropriate? Yes; Fluid consistency: Thin  Neurologic Exam: Mental Status: Alert, oriented, thought content appropriate.  Speech fluent without evidence of aphasia.  Able to follow 3 step commands without difficulty. Cranial Nerves: II: Discs flat bilaterally; Visual fields grossly normal, pupils equal, round, reactive to light and accommodation III,IV, VI: ptosis not present, extra-ocular motions intact bilaterally V,VII: smile symmetric, facial light touch sensation normal bilaterally VIII: hearing normal bilaterally IX,X: gag reflex present XI: bilateral shoulder shrug XII: midline tongue extension Motor: Right :  Upper extremity   5/5                                      Left:     Upper extremity   5/5             Lower extremity   5/5                                                  Lower extremity   5/5   Lab Results: Basic Metabolic Panel:  Recent Labs Lab 07/19/16 0555 07/20/16 0440  NA 140 137  K 4.2 3.4*  CL 106 105  CO2 24 25  GLUCOSE 118* 99  BUN 8 10  CREATININE 0.56 0.71  CALCIUM 9.3 9.1    Liver Function Tests:  Recent Labs Lab 07/19/16 0555  AST 33  ALT 21  ALKPHOS 52  BILITOT 0.6  PROT 7.4   ALBUMIN 3.7   No results for input(s): LIPASE, AMYLASE in the last 168 hours. No results for input(s): AMMONIA in the last 168 hours.  CBC:  Recent Labs Lab 07/19/16 0555 07/20/16 0440  WBC 2.7* 3.2*  HGB 11.9* 12.2  HCT 35.8 37.5  MCV 79.4* 81.0  PLT 244 239    Cardiac Enzymes:  Recent Labs Lab 07/19/16 0555  TROPONINI <0.03    Lipid Panel: No results for input(s): CHOL, TRIG, HDL, CHOLHDL, VLDL, LDLCALC in the last 168 hours.  CBG: No results for input(s): GLUCAP in the last 168 hours.  Microbiology: Results for orders placed or performed during the hospital encounter of 01/29/15  MRSA PCR Screening     Status: None   Collection Time: 01/29/15  8:39 PM  Result Value Ref Range Status   MRSA by PCR NEGATIVE NEGATIVE Final    Comment:        The GeneXpert MRSA Assay (FDA approved for NASAL specimens  only), is one component of a comprehensive MRSA colonization surveillance program. It is not intended to diagnose MRSA infection nor to guide or monitor treatment for MRSA infections.     Coagulation Studies: No results for input(s): LABPROT, INR in the last 72 hours.  Imaging: Ct Head Wo Contrast  Result Date: 07/19/2016 CLINICAL DATA:  Initial evaluation for acute aphasia, hypertensive. EXAM: CT HEAD WITHOUT CONTRAST TECHNIQUE: Contiguous axial images were obtained from the base of the skull through the vertex without intravenous contrast. COMPARISON:  Prior CT from 06/03/2016. FINDINGS: Brain: Cerebral volume within normal limits. No acute intracranial hemorrhage identified. No evidence for acute large vessel territory infarct. No mass lesion, midline shift, or mass effect. No hydrocephalus. No extra-axial fluid collection. Vascular: No hyperdense vessel. Skull: Scalp soft tissues within normal limits.  Calvarium intact. Sinuses/Orbits: The globes and orbital soft tissues within normal limits. Paranasal sinuses are clear. No significant mastoid effusion.  IMPRESSION: Negative head CT.  No acute intracranial process identified. Electronically Signed   By: Rise MuBenjamin  McClintock M.D.   On: 07/19/2016 06:40   Mr Brain Wo Contrast  Result Date: 07/19/2016 CLINICAL DATA:  Hypertension, smoker.  Confusion EXAM: MRI HEAD WITHOUT CONTRAST TECHNIQUE: Multiplanar, multiecho pulse sequences of the brain and surrounding structures were obtained without intravenous contrast. COMPARISON:  CT head 07/19/2016 FINDINGS: Brain: Ventricle size normal.  Cerebral volume normal. Negative for acute or chronic infarction. Normal white matter. Brainstem and cerebellum normal. Negative for hemorrhage or mass. Vascular: Normal arterial flow voids. Skull and upper cervical spine: Negative Sinuses/Orbits: Mild mastoid effusion on the left. Mild mucosal edema in the paranasal sinuses. Normal orbit. Other: none IMPRESSION: Negative MRI of the head. Electronically Signed   By: Marlan Palauharles  Clark M.D.   On: 07/19/2016 11:55    Medications:  I have reviewed the patient's current medications. Scheduled: . amLODipine  10 mg Oral Daily  . ARIPiprazole  10 mg Oral Daily  . aspirin EC  81 mg Oral Daily  . divalproex  500 mg Oral Daily  . enoxaparin (LOVENOX) injection  40 mg Subcutaneous Q24H  . ferrous sulfate  325 mg Oral Q breakfast  . FLUoxetine  40 mg Oral Daily  . folic acid  1 mg Oral Daily  . hydrochlorothiazide  50 mg Oral Daily   And  . irbesartan  300 mg Oral Daily  . LORazepam  0-4 mg Oral Q6H   Followed by  . [START ON 07/21/2016] LORazepam  0-4 mg Oral Q12H  . metoprolol succinate  200 mg Oral Daily  . multivitamin with minerals  1 tablet Oral Daily  . nicotine  21 mg Transdermal Daily  . sodium chloride flush  3 mL Intravenous Q12H  . thiamine  100 mg Oral Daily   Or  . thiamine  100 mg Intravenous Daily    Assessment/Plan: Patient at baseline.  EEG from yesterday quite slow.  Unclear significance.  Would not assume patient was post-ictal at this time.   ?drowse/sleep.  Anticonvulsant therapy not indicated.   No further neurologic intervention is recommended at this time.  If further questions arise, please call or page at that time.  Thank you for allowing neurology to participate in the care of this patient.  Patient to follow up as an outpatient.      LOS: 1 day   Thana FarrLeslie Waunetta Riggle, MD Neurology [phone] 07/20/2016  10:31 AM

## 2016-07-20 NOTE — Progress Notes (Addendum)
Patient discharged via wheelchair and private vehicle. IV removed and catheter intact. All discharge instructions given and patient verbalizes understanding. Tele removed and returned. Prescriptions given to patient No distress noted.   

## 2016-07-20 NOTE — Discharge Instructions (Signed)
Hydrochlorothiazide, HCTZ; Irbesartan tablets What is this medicine? HYDROCHLOROTHIAZIDE; IRBESARTAN (hye dro klor oh THYE a zide; ir be SAR tan) is a combination of a drug that is a diuetic and one that relaxes blood vessels. It is used to treat high blood pressure. This medicine may be used for other purposes; ask your health care provider or pharmacist if you have questions. COMMON BRAND NAME(S): Avalide What should I tell my health care provider before I take this medicine? They need to know if you have any of these conditions: -decreased urine -if you are on a special diet, like a low salt diet -immune system problems, like lupus -kidney disease -liver disease -an unusual or allergic reaction to irbesartan, hydrochlorothiazide, sulfa drugs, other medicines, foods, dyes, or preservatives -pregnant or trying to get pregnant -breast-feeding How should I use this medicine? Take this medicine by mouth with a glass of water. Follow the directions on the prescription label. You can take it with or without food. If it upsets your stomach, take it with food. Take your medicine at regular intervals. Do not take it more often than directed. Do not stop taking except on your doctor's advice. Talk to your pediatrician regarding the use of this medicine in children. Special care may be needed. Overdosage: If you think you have taken too much of this medicine contact a poison control center or emergency room at once. NOTE: This medicine is only for you. Do not share this medicine with others. What if I miss a dose? If you miss a dose, take it as soon as you can. If it is almost time for your next dose, take only that dose. Do not take double or extra doses. What may interact with this medicine? -barbiturates like phenobarbital -corticosteroids like prednisone -diabetic medicines -diuretics like triamterene, spironolactone or amiloride -lithium -NSAIDs like ibuprofen -potassium salts or potassium  supplements -prescription pain medicines -skeletal muscle relaxants like tubocurarine -some cholesterol lowering medicines like cholestyramine or colestipol This list may not describe all possible interactions. Give your health care provider a list of all the medicines, herbs, non-prescription drugs, or dietary supplements you use. Also tell them if you smoke, drink alcohol, or use illegal drugs. Some items may interact with your medicine. What should I watch for while using this medicine? You must visit your health care professional for regular checks on your progress. Check your blood pressure regularly while you are taking this medicine. Ask your doctor or health care professional what your blood pressure should be and when you should contact him or her. When you check your blood pressure, write down the measurements to show your doctor or health care professional. You must not get dehydrated. Ask your doctor or health care professional how much fluid you need to drink a day. Check with him or her if you get an attack of severe diarrhea, nausea and vomiting, or if you sweat a lot. The loss of too much body fluid can make it dangerous for you to take this medicine. Women should inform their doctor if they wish to become pregnant or think they might be pregnant. There is a potential for serious side effects to an unborn child, particularly in the second or third trimester. Talk to your health care professional or pharmacist for more information. You may get drowsy or dizzy. Do not drive, use machinery, or do anything that needs mental alertness until you know how this drug affects you. Do not stand or sit up quickly, especially if you are  an older patient. This reduces the risk of dizzy or fainting spells. Alcohol can make you more drowsy and dizzy. Avoid alcoholic drinks. This medicine may affect your blood sugar level. If you have diabetes, check with your doctor or health care professional before  changing the dose of your diabetic medicine. Avoid salt substitutes unless you are told otherwise by your doctor or health care professional. This medicine can make you more sensitive to the sun. Keep out of the sun. If you cannot avoid being in the sun, wear protective clothing and use sunscreen. Do not use sun lamps or tanning beds/booths. Do not treat yourself for coughs, colds, or pain while you are taking this medicine without asking your doctor or health care professional for advice. Some ingredients may increase your blood pressure. What side effects may I notice from receiving this medicine? Side effects that you should report to your doctor or health care professional as soon as possible: -allergic reactions like skin rash, itching or hives, swelling of the face, lips, or tongue -breathing problems -changes in vision -dark urine -eye pain -fast or irregular heart beat, palpitations, or chest pain -feeling faint or lightheaded -muscle cramps -persistent dry cough -redness, blistering, peeling or loosening of the skin, including inside the mouth -stomach pain -trouble passing urine or change in the amount of urine -unusual bleeding or bruising -worsened gout pain -yellowing of the eyes or skin Side effects that usually do not require medical attention (report to your doctor or health care professional if they continue or are bothersome): -change in sex drive or performance -headache This list may not describe all possible side effects. Call your doctor for medical advice about side effects. You may report side effects to FDA at 1-800-FDA-1088. Where should I keep my medicine? Keep out of the reach of children. Store at room temperature between 15 and 30 degrees C (59 and 86 degrees F). Throw away any unused medicine after the expiration date. NOTE: This sheet is a summary. It may not cover all possible information. If you have questions about this medicine, talk to your doctor,  pharmacist, or health care provider.  2017 Elsevier/Gold Standard (2010-05-12 13:26:49) Nicotine skin patches What is this medicine? NICOTINE (NIK oh teen) helps people stop smoking. The patches replace the nicotine found in cigarettes and help to decrease withdrawal effects. They are most effective when used in combination with a stop-smoking program. This medicine may be used for other purposes; ask your health care provider or pharmacist if you have questions. COMMON BRAND NAME(S): Habitrol, Nicoderm CQ, Nicotrol What should I tell my health care provider before I take this medicine? They need to know if you have any of these conditions: -diabetes -heart disease, angina, irregular heartbeat or previous heart attack -high blood pressure -lung disease, including asthma -overactive thyroid -pheochromocytoma -seizures or a history of seizures -skin problems, like eczema -stomach problems or ulcers -an unusual or allergic reaction to nicotine, adhesives, other medicines, foods, dyes, or preservatives -pregnant or trying to get pregnant -breast-feeding How should I use this medicine? This medicine is for use on the skin. Follow the directions that come with the patches. Find an area of skin on your upper arm, chest, or back that is clean, dry, greaseless, undamaged and hairless. Wash hands with plain soap and water. Do not use anything that contains aloe, lanolin or glycerin as these may prevent the patch from sticking. Dry thoroughly. Remove the patch from the sealed pouch. Do not try to cut or  trim the patch. Using your palm, press the patch firmly in place for 10 seconds to make sure that there is good contact with your skin. After applying the patch, wash your hands. Change the patch every day, keeping to a regular schedule. When you apply a new patch, use a new area of skin. Wait at least 1 week before using the same area again. Talk to your pediatrician regarding the use of this medicine  in children. Special care may be needed. Overdosage: If you think you have taken too much of this medicine contact a poison control center or emergency room at once. NOTE: This medicine is only for you. Do not share this medicine with others. What if I miss a dose? If you forget to replace a patch, use it as soon as you can. Only use one patch at a time and do not leave on the skin for longer than directed. If a patch falls off, you can replace it, but keep to your schedule and remove the patch at the right time. What may interact with this medicine? -medicines for asthma -medicines for blood pressure -medicines for mental depression This list may not describe all possible interactions. Give your health care provider a list of all the medicines, herbs, non-prescription drugs, or dietary supplements you use. Also tell them if you smoke, drink alcohol, or use illegal drugs. Some items may interact with your medicine. What should I watch for while using this medicine? You should begin using the nicotine patch the day you stop smoking. It is okay if you do not succeed at your attempt to quit and have a cigarette. You can still continue your quit attempt and keep using the product as directed. Just throw away your cigarettes and get back to your quit plan. You can keep the patch in place during swimming, bathing, and showering. If your patch falls off during these activities, replace it. When you first apply the patch, your skin may itch or burn. This should go away soon. When you remove a patch, the skin may look red, but this should only last for a few days. Call your doctor or health care professional if skin redness does not go away after 4 days, if your skin swells, or if you get a rash. If you are a diabetic and you quit smoking, the effects of insulin may be increased and you may need to reduce your insulin dose. Check with your doctor or health care professional about how you should adjust your  insulin dose. If you are going to have a magnetic resonance imaging (MRI) procedure, tell your MRI technician if you have this patch on your body. It must be removed before a MRI. What side effects may I notice from receiving this medicine? Side effects that you should report to your doctor or health care professional as soon as possible: -allergic reactions like skin rash, itching or hives, swelling of the face, lips, or tongue -breathing problems -changes in hearing -changes in vision -chest pain -cold sweats -confusion -fast, irregular heartbeat -feeling faint or lightheaded, falls -headache -increased saliva -skin redness that lasts more than 4 days -stomach pain -signs and symptoms of nicotine overdose like nausea; vomiting; dizziness; weakness; and rapid heartbeat Side effects that usually do not require medical attention (report to your doctor or health care professional if they continue or are bothersome): -diarrhea -dry mouth -hiccups -irritability -nervousness or restlessness -trouble sleeping or vivid dreams This list may not describe all possible side effects.  Call your doctor for medical advice about side effects. You may report side effects to FDA at 1-800-FDA-1088. Where should I keep my medicine? Keep out of the reach of children. Store at room temperature between 20 and 25 degrees C (68 and 77 degrees F). Protect from heat and light. Store in [employer] packaging until ready to use. Throw away unused medicine after the expiration date. When you remove a patch, fold with sticky sides together; put in an empty opened pouch and throw away. NOTE: This sheet is a summary. It may not cover all possible information. If you have questions about this medicine, talk to your doctor, pharmacist, or health care provider.  2017 Elsevier/Gold Standard (2014-07-21 15:46:21) Metoprolol tablets What is this medicine? METOPROLOL (me TOE proe lole) is a beta-blocker. Beta-blockers  reduce the workload on the heart and help it to beat more regularly. This medicine is used to treat high blood pressure and to prevent chest pain. It is also used to after a heart attack and to prevent an additional heart attack from occurring. This medicine may be used for other purposes; ask your health care provider or pharmacist if you have questions. COMMON BRAND NAME(S): Lopressor What should I tell my health care provider before I take this medicine? They need to know if you have any of these conditions: -diabetes -heart or vessel disease like slow heart rate, worsening heart failure, heart block, sick sinus syndrome or Raynaud's disease -kidney disease -liver disease -lung or breathing disease, like asthma or emphysema -pheochromocytoma -thyroid disease -an unusual or allergic reaction to metoprolol, other beta-blockers, medicines, foods, dyes, or preservatives -pregnant or trying to get pregnant -breast-feeding How should I use this medicine? Take this medicine by mouth with a drink of water. Follow the directions on the prescription label. Take this medicine immediately after meals. Take your doses at regular intervals. Do not take more medicine than directed. Do not stop taking this medicine suddenly. This could lead to serious heart-related effects. Talk to your pediatrician regarding the use of this medicine in children. Special care may be needed. Overdosage: If you think you have taken too much of this medicine contact a poison control center or emergency room at once. NOTE: This medicine is only for you. Do not share this medicine with others. What if I miss a dose? If you miss a dose, take it as soon as you can. If it is almost time for your next dose, take only that dose. Do not take double or extra doses. What may interact with this medicine? This medicine may interact with the following medications: -certain medicines for blood pressure, heart disease, irregular heart  beat -certain medicines for depression like monoamine oxidase (MAO) inhibitors, fluoxetine, or paroxetine -clonidine -dobutamine -epinephrine -isoproterenol -reserpine This list may not describe all possible interactions. Give your health care provider a list of all the medicines, herbs, non-prescription drugs, or dietary supplements you use. Also tell them if you smoke, drink alcohol, or use illegal drugs. Some items may interact with your medicine. What should I watch for while using this medicine? Visit your doctor or health care professional for regular check ups. Contact your doctor right away if your symptoms worsen. Check your blood pressure and pulse rate regularly. Ask your health care professional what your blood pressure and pulse rate should be, and when you should contact them. You may get drowsy or dizzy. Do not drive, use machinery, or do anything that needs mental alertness until you know how this  medicine affects you. Do not sit or stand up quickly, especially if you are an older patient. This reduces the risk of dizzy or fainting spells. Contact your doctor if these symptoms continue. Alcohol may interfere with the effect of this medicine. Avoid alcoholic drinks. What side effects may I notice from receiving this medicine? Side effects that you should report to your doctor or health care professional as soon as possible: -allergic reactions like skin rash, itching or hives -cold or numb hands or feet -depression -difficulty breathing -faint -fever with sore throat -irregular heartbeat, chest pain -rapid weight gain -swollen legs or ankles Side effects that usually do not require medical attention (report to your doctor or health care professional if they continue or are bothersome): -anxiety or nervousness -change in sex drive or performance -dry skin -headache -nightmares or trouble sleeping -short term memory loss -stomach upset or diarrhea -unusually tired This  list may not describe all possible side effects. Call your doctor for medical advice about side effects. You may report side effects to FDA at 1-800-FDA-1088. Where should I keep my medicine? Keep out of the reach of children. Store at room temperature between 15 and 30 degrees C (59 and 86 degrees F). Throw away any unused medicine after the expiration date. NOTE: This sheet is a summary. It may not cover all possible information. If you have questions about this medicine, talk to your doctor, pharmacist, or health care provider.  2017 Elsevier/Gold Standard (2013-04-26 14:40:36) Amlodipine tablets What is this medicine? AMLODIPINE (am LOE di peen) is a calcium-channel blocker. It affects the amount of calcium found in your heart and muscle cells. This relaxes your blood vessels, which can reduce the amount of work the heart has to do. This medicine is used to lower high blood pressure. It is also used to prevent chest pain. This medicine may be used for other purposes; ask your health care provider or pharmacist if you have questions. COMMON BRAND NAME(S): Norvasc What should I tell my health care provider before I take this medicine? They need to know if you have any of these conditions: -heart problems like heart failure or aortic stenosis -liver disease -an unusual or allergic reaction to amlodipine, other medicines, foods, dyes, or preservatives -pregnant or trying to get pregnant -breast-feeding How should I use this medicine? Take this medicine by mouth with a glass of water. Follow the directions on the prescription label. Take your medicine at regular intervals. Do not take more medicine than directed. Talk to your pediatrician regarding the use of this medicine in children. Special care may be needed. This medicine has been used in children as young as 6. Persons over 27 years old may have a stronger reaction to this medicine and need smaller doses. Overdosage: If you think you have  taken too much of this medicine contact a poison control center or emergency room at once. NOTE: This medicine is only for you. Do not share this medicine with others. What if I miss a dose? If you miss a dose, take it as soon as you can. If it is almost time for your next dose, take only that dose. Do not take double or extra doses. What may interact with this medicine? -herbal or dietary supplements -local or general anesthetics -medicines for high blood pressure -medicines for prostate problems -rifampin This list may not describe all possible interactions. Give your health care provider a list of all the medicines, herbs, non-prescription drugs, or dietary supplements you use.  Also tell them if you smoke, drink alcohol, or use illegal drugs. Some items may interact with your medicine. What should I watch for while using this medicine? Visit your doctor or health care professional for regular check ups. Check your blood pressure and pulse rate regularly. Ask your health care professional what your blood pressure and pulse rate should be, and when you should contact him or her. This medicine may make you feel confused, dizzy or lightheaded. Do not drive, use machinery, or do anything that needs mental alertness until you know how this medicine affects you. To reduce the risk of dizzy or fainting spells, do not sit or stand up quickly, especially if you are an older patient. Avoid alcoholic drinks; they can make you more dizzy. Do not suddenly stop taking amlodipine. Ask your doctor or health care professional how you can gradually reduce the dose. What side effects may I notice from receiving this medicine? Side effects that you should report to your doctor or health care professional as soon as possible: -allergic reactions like skin rash, itching or hives, swelling of the face, lips, or tongue -breathing problems -changes in vision or hearing -chest pain -fast, irregular heartbeat -swelling  of legs or ankles Side effects that usually do not require medical attention (report to your doctor or health care professional if they continue or are bothersome): -dry mouth -facial flushing -nausea, vomiting -stomach gas, pain -tired, weak -trouble sleeping This list may not describe all possible side effects. Call your doctor for medical advice about side effects. You may report side effects to FDA at 1-800-FDA-1088. Where should I keep my medicine? Keep out of the reach of children. Store at room temperature between 59 and 86 degrees F (15 and 30 degrees C). Protect from light. Keep container tightly closed. Throw away any unused medicine after the expiration date. NOTE: This sheet is a summary. It may not cover all possible information. If you have questions about this medicine, talk to your doctor, pharmacist, or health care provider.  2017 Elsevier/Gold Standard (2012-07-20 11:40:58) Hypertension Hypertension is another name for high blood pressure. High blood pressure forces your heart to work harder to pump blood. A blood pressure reading has two numbers, which includes a higher number over a lower number (example: 110/72). Follow these instructions at home:  Have your blood pressure rechecked by your doctor.  Only take medicine as told by your doctor. Follow the directions carefully. The medicine does not work as well if you skip doses. Skipping doses also puts you at risk for problems.  Do not smoke.  Monitor your blood pressure at home as told by your doctor. Contact a doctor if:  You think you are having a reaction to the medicine you are taking.  You have repeat headaches or feel dizzy.  You have puffiness (swelling) in your ankles.  You have trouble with your vision. Get help right away if:  You get a very bad headache and are confused.  You feel weak, numb, or faint.  You get chest or belly (abdominal) pain.  You throw up (vomit).  You cannot breathe very  well. This information is not intended to replace advice given to you by your health care provider. Make sure you discuss any questions you have with your health care provider. Document Released: 02/08/2008 Document Revised: 01/28/2016 Document Reviewed: 06/14/2013 Elsevier Interactive Patient Education  2017 [employer]   Managing Your Hypertension Hypertension is commonly called high blood pressure. Blood pressure is a  measurement of how strongly your blood is pressing against the walls of your arteries. Arteries are blood vessels that carry blood from your heart throughout your body. Blood pressure does not stay the same. It rises when you are active, excited, or nervous. It lowers when you are sleeping or relaxed. If the numbers that measure your blood pressure stay above normal most of the time, you are at risk for health problems. Hypertension is a long-term (chronic) condition in which blood pressure is elevated. This condition often has no signs or symptoms. The cause of the condition is usually not known. What are blood pressure readings? A blood pressure reading is recorded as two numbers, such as "120 over 80" (or 120/80). The first ("top") number is called the systolic pressure. It is a measure of the pressure in your arteries as the heart beats. The second ("bottom") number is called the diastolic pressure. It is a measure of the pressure in your arteries as the heart relaxes between beats. What does my blood pressure reading mean? Blood pressure is classified into four stages. Based on your blood pressure reading, your health care provider may use the following stages to determine what type of treatment, if any, is needed. Systolic pressure and diastolic pressure are measured in a unit called mm Hg. Normal  Systolic pressure: below 120.  Diastolic pressure: below 80. Prehypertension  Systolic pressure: 120-139.  Diastolic pressure: 80-89. Hypertension stage 1  Systolic  pressure: 140-159.  Diastolic pressure: 90-99. Hypertension stage 2  Systolic pressure: 160 or above.  Diastolic pressure: 100 or above. What health risks are associated with hypertension? Managing your hypertension is an important responsibility. Uncontrolled hypertension can lead to:  A heart attack.  A stroke.  A weakened blood vessel (aneurysm).  Heart failure.  Kidney damage.  Eye damage.  Metabolic syndrome.  Memory and concentration problems. What changes can I make to manage my hypertension? Hypertension can be managed effectively by making lifestyle changes and possibly by taking medicines. Your health care provider will help you come up with a plan to bring your blood pressure within a normal range. Your plan should include the following: Monitoring  Monitor your blood pressure at home as told by your health care provider. Your personal target blood pressure may vary depending on your medical conditions, your age, and other factors.  Have your blood pressure rechecked as told by your health care provider. Lifestyle  Lose weight if necessary.  Get at least 30-45 minutes of aerobic exercise at least 4 times a week.  Do not use any products that contain nicotine or tobacco, such as cigarettes and e-cigarettes. If you need help quitting, ask your health care provider.  Learn ways to reduce stress.  Control any chronic conditions, such as high cholesterol or diabetes. Eating and drinking  Follow the DASH diet. This diet is high in fruits, vegetables, and whole grains. It is low in salt, red meat, and added sugars.  Keep your sodium intake below 2,300 mg per day.  Limit alcoholic beverages. Communication  Review all the medicines you take with your health care provider because there may be side effects or interactions.  Talk with your health care provider about your diet, exercise habits, and other lifestyle factors that may be contributing to  hypertension.  See your health care provider regularly. Your health care provider can help you create and adjust your plan for managing hypertension. Will I need medicine to control my blood pressure? Your health care provider may  prescribe medicine if lifestyle changes are not enough to get your blood pressure under control, and if one of the following is true:  You are 5-2 years of age, and your systolic blood pressure is 140 or higher.  You are 45 years of age or older, and your systolic blood pressure is 150 or higher.  Your diastolic blood pressure is 90 or higher.  You have diabetes, and your systolic blood pressure is over 140 or your diastolic blood pressure is over 90.  You have kidney disease, and your blood pressure is above 140/90.  You have heart disease or a history of stroke, and your blood pressure is 140/90 or higher. Take medicines only as told by your health care provider. Follow the directions carefully. Blood pressure medicines must be taken as prescribed. The medicine does not work as well when you skip doses. Skipping doses also puts you at risk for problems. Contact a health care provider if:  You think you are having a reaction to medicines you have taken.  You have repeated (recurrent) headaches.  You feel dizzy.  You have swelling in your ankles.  You have trouble with your vision. Get help right away if:  You develop a severe headache or confusion.  You have unusual weakness or numbness, or you feel faint.  You have severe pain in your chest or abdomen.  You vomit repeatedly.  You have trouble breathing. This information is not intended to replace advice given to you by your health care provider. Make sure you discuss any questions you have with your health care provider. Document Released: 05/16/2012 Document Revised: 04/26/2016 Document Reviewed: 11/20/2015 Elsevier Interactive Patient Education  2017 Elsevier Inc.   Preventing  Hypertension Hypertension, commonly called high blood pressure, is when the force of blood pumping through the arteries is too strong. Arteries are blood vessels that carry blood from the heart throughout the body. Over time, hypertension can damage the arteries and decrease blood flow to important parts of the body, including the brain, heart, and kidneys. Often, hypertension does not cause symptoms until blood pressure is very high. For this reason, it is important to have your blood pressure checked on a regular basis. Hypertension can often be prevented with diet and lifestyle changes. If you already have hypertension, you can control it with diet and lifestyle changes, as well as medicine. What nutrition changes can be made? Maintain a healthy diet. This includes:  Eating less salt (sodium). Ask your health care provider how much sodium is safe for you to have. The general recommendation is to consume less than 1 tsp (2,300 mg) of sodium a day.  Do not add salt to your food.  Choose low-sodium options when grocery shopping and eating out.  Limiting fats in your diet. You can do this by eating low-fat or fat-free dairy products and by eating less red meat.  Eating more fruits, vegetables, and whole grains. Make a goal to eat:  1-2 cups of fresh fruits and vegetables each day.  3-4 servings of whole grains each day.  Avoiding foods and beverages that have added sugars.  Eating fish that contain healthy fats (omega-3 fatty acids), such as mackerel or salmon. If you need help putting together a healthy eating plan, try the DASH diet. This diet is high in fruits, vegetables, and whole grains. It is low in sodium, red meat, and added sugars. DASH stands for Dietary Approaches to Stop Hypertension. What lifestyle changes can be made?  Lose weight  if you are overweight. Losing just 3?5% of your body weight can help prevent or control hypertension.  For example, if your present weight is 200  lb (91 kg), a loss of 3-5% of your weight means losing 6-10 lb (2.7-4.5 kg).  Ask your health care provider to help you with a diet and exercise plan to safely lose weight.  Get enough exercise. Do at least 150 minutes of moderate-intensity exercise each week.  You could do this in short exercise sessions several times a day, or you could do longer exercise sessions a few times a week. For example, you could take a brisk 10-minute walk or bike ride, 3 times a day, for 5 days a week.  Find ways to reduce stress, such as exercising, meditating, listening to music, or taking a yoga class. If you need help reducing stress, ask your health care provider.  Do not smoke. This includes e-cigarettes. Chemicals in tobacco and nicotine products raise your blood pressure each time you smoke. If you need help quitting, ask your health care provider.  Avoid alcohol. If you drink alcohol, limit alcohol intake to no more than 1 drink a day for nonpregnant women and 2 drinks a day for men. One drink equals 12 oz of beer, 5 oz of wine, or 1 oz of hard liquor. Why are these changes important? Diet and lifestyle changes can help you prevent hypertension, and they may make you feel better overall and improve your quality of life. If you have hypertension, making these changes will help you control it and help prevent major complications, such as:  Hardening and narrowing of arteries that supply blood to:  Your heart. This can cause a heart attack.  Your brain. This can cause a stroke.  Your kidneys. This can cause kidney failure.  Stress on your heart muscle, which can cause heart failure. What can I do to lower my risk?  Work with your health care provider to make a hypertension prevention plan that works for you. Follow your plan and keep all follow-up visits as told by your health care provider.  Learn how to check your blood pressure at home. Make sure that you know your personal target blood pressure,  as told by your health care provider. How is this treated? In addition to diet and lifestyle changes, your health care provider may recommend medicines to help lower your blood pressure. You may need to try a few different medicines to find what works best for you. You also may need to take more than one medicine. Take over-the-counter and prescription medicines only as told by your health care provider. Where to find support: Your health care provider can help you prevent hypertension and help you keep your blood pressure at a healthy level. Your local hospital or your community may also provide support services and prevention programs. The American Heart Association offers an online support network at: https://www.lee.net/ Where to find more information: Learn more about hypertension from:  National Heart, Lung, and Blood Institute: https://www.peterson.org/  Centers for Disease Control and Prevention: AboutHD.co.nz  American Academy of Family Physicians: http://familydoctor.org/familydoctor/en/diseases-conditions/high-blood-pressure.printerview.all.html Learn more about the DASH diet from:  National Heart, Lung, and Blood Institute: WedMap.it Contact a health care provider if:  You think you are having a reaction to medicines you have taken.  You have recurrent headaches or feel dizzy.  You have swelling in your ankles.  You have trouble with your vision. Summary  Hypertension often does not cause any symptoms until blood  pressure is very high. It is important to get your blood pressure checked regularly.  Diet and lifestyle changes are the most important steps in preventing hypertension.  By keeping your blood pressure in a healthy range, you can prevent complications like heart attack, heart failure, stroke, and kidney failure.  Work with your health care provider to make  a hypertension prevention plan that works for you. This information is not intended to replace advice given to you by your health care provider. Make sure you discuss any questions you have with your health care provider. Document Released: 09/06/2015 Document Revised: 05/02/2016 Document Reviewed: 05/02/2016 Elsevier Interactive Patient Education  2017 [employer]

## 2016-07-20 NOTE — Discharge Summary (Signed)
[facility] [place] [facility]   PATIENT NAME: Tara FinnerLisa Lamb    MR#:  [id number]  DATE OF BIRTH:  [birth year]  DATE OF ADMISSION:  07/19/2016 ADMITTING PHYSICIAN: Adrian SaranSital Varshini Arrants, MD  DATE OF DISCHARGE: 07/20/2016  PRIMARY CARE PHYSICIAN: No PCP Per Patient    ADMISSION DIAGNOSIS:  Transient alteration of awareness [R40.4] Hypertension, unspecified type [I10]  DISCHARGE DIAGNOSIS:  Active Problems:   Encephalopathy   SECONDARY DIAGNOSIS:   Past Medical History:  Diagnosis Date  . Bipolar 1 disorder (HCC)    a. off meds x several years.  . Essential hypertension   . ETOH abuse    a. at least 2 beers daily, drinks liquor heavily every other weekend.  . Noncompliance   . Polysubstance abuse    a. last used marijuana 01/2015, last used cocaine 09/2014.  [name]. Precordial chest pain   . Tobacco abuse    a. 3-4 cigarettes daily x 30 yrs.    HOSPITAL COURSE:   50 year old female with history of essential hypertension who presents with encephalopathy.  1. Acute hypertensive encephalopathy: Patient presented with systolic blood pressure over 200. She underwent CT scan and MRI of the brain which essentially were negative. She also underwent EEG which essentially shows slowing but not presumed to be due to seizure, more likely in a drowsy state.  There is no infectious etiology of encephalopathy. Urine toxicology was negative as well as blood alcohol level Her symptoms have markedly improved. She needs closer monitoring of her blood pressure and better control. Her blood pressure medications were increased and Norvasc was also added to her regimen. She was asked to closely follow her blood pressure at home and write this down for her primary care physician to review. 2. Tobacco dependence: Patient will be discharged with nicotine patch.  3. EtOH abuse: Patient was on CIWA protocol with uneventful detox.  4. Bipolar: Patient will continue Depakote however this dose  was decreased.   DISCHARGE CONDITIONS AND DIET:   Stable for discharge on low sodium heart healthy diet  CONSULTS OBTAINED:  Treatment Team:  Kym GroomNeuro1 [facility], MD Thana FarrLeslie Reynolds, MD  DRUG ALLERGIES:   Allergies  Allergen Reactions  . Banana Hives    DISCHARGE MEDICATIONS:   Current Discharge Medication List    START taking these medications   Details  amLODipine (NORVASC) 10 MG tablet Take 1 tablet (10 mg total) by mouth daily. Qty: 30 tablet, Refills: 0    hydrochlorothiazide (HYDRODIURIL) 50 MG tablet Take 1 tablet (50 mg total) by mouth daily. Qty: 30 tablet, Refills: 0    irbesartan (AVAPRO) 300 MG tablet Take 1 tablet (300 mg total) by mouth daily. Qty: 30 tablet, Refills: 0    nicotine (NICODERM CQ - DOSED IN MG/24 HOURS) 21 mg/24hr patch Place 1 patch (21 mg total) onto the skin daily. Qty: 28 patch, Refills: 0      CONTINUE these medications which have CHANGED   Details  divalproex (DEPAKOTE ER) 500 MG 24 hr tablet Take 1 tablet (500 mg total) by mouth daily. Qty: 30 tablet, Refills: 0    metoprolol (TOPROL-XL) 200 MG 24 hr tablet Take 1 tablet (200 mg total) by mouth daily. Take with or immediately following a meal. Qty: 30 tablet, Refills: 0      CONTINUE these medications which have NOT CHANGED   Details  FLUoxetine (PROZAC) 40 MG capsule Take 1 capsule (40 mg total) by mouth daily. Qty: 30 capsule, Refills: 3    ARIPiprazole (  ABILIFY) 10 MG tablet Take 1 tablet (10 mg total) by mouth daily. Qty: 30 tablet, Refills: 0    aspirin EC 81 MG EC tablet Take 1 tablet (81 mg total) by mouth daily. Qty: 30 tablet, Refills: 0    ferrous sulfate 325 (65 FE) MG tablet Take 1 tablet (325 mg total) by mouth daily with breakfast. TAKE WITH ORANGE JUICE Qty: 30 tablet, Refills: 0      STOP taking these medications     carvedilol (COREG) 25 MG tablet      valsartan-hydrochlorothiazide (DIOVAN HCT) 160-25 MG per tablet               Today    CHIEF COMPLAINT:   She is aging very well this morning. Denies headache or neurological deficits   VITAL SIGNS:  Blood pressure (!) 178/97, pulse 79, temperature 97.9 F (36.6 C), temperature source Oral, resp. rate 18, height 5\' 6"  (1.676 m), weight 78.6 kg (173 lb 4.5 oz), SpO2 98 %.   REVIEW OF SYSTEMS:  Review of Systems  Constitutional: Negative.  Negative for chills, fever and malaise/fatigue.  HENT: Negative.  Negative for ear discharge, ear pain, hearing loss, nosebleeds and sore throat.   Eyes: Negative.  Negative for blurred vision and pain.  Respiratory: Negative.  Negative for cough, hemoptysis, shortness of breath and wheezing.   Cardiovascular: Negative.  Negative for chest pain, palpitations and leg swelling.  Gastrointestinal: Negative.  Negative for abdominal pain, blood in stool, diarrhea, nausea and vomiting.  Genitourinary: Negative.  Negative for dysuria.  Musculoskeletal: Negative.  Negative for back pain.  Skin: Negative.   Neurological: Negative for dizziness, tremors, speech change, focal weakness, seizures and headaches.  Endo/Heme/Allergies: Negative.  Does not bruise/bleed easily.  Psychiatric/Behavioral: Negative.  Negative for depression, hallucinations and suicidal ideas.     PHYSICAL EXAMINATION:  GENERAL:  50 y.o.-year-old patient lying in the bed with no acute distress.  NECK:  Supple, no jugular venous distention. No thyroid enlargement, no tenderness.  LUNGS: Normal breath sounds bilaterally, no wheezing, rales,rhonchi  No use of accessory muscles of respiration.  CARDIOVASCULAR: S1, S2 normal. No murmurs, rubs, or gallops.  ABDOMEN: Soft, non-tender, non-distended. Bowel sounds present. No organomegaly or mass.  EXTREMITIES: No pedal edema, cyanosis, or clubbing.  PSYCHIATRIC: The patient is alert and oriented x 3.  SKIN: No obvious rash, lesion, or ulcer.   DATA REVIEW:   CBC  Recent Labs Lab 07/20/16 0440  WBC 3.2*  HGB 12.2   HCT 37.5  PLT 239    Chemistries   Recent Labs Lab 07/19/16 0555 07/20/16 0440  NA 140 137  K 4.2 3.4*  CL 106 105  CO2 24 25  GLUCOSE 118* 99  BUN 8 10  CREATININE 0.56 0.71  CALCIUM 9.3 9.1  AST 33  --   ALT 21  --   ALKPHOS 52  --   BILITOT 0.6  --     Cardiac Enzymes  Recent Labs Lab 07/19/16 0555  TROPONINI <0.03    Microbiology Results  @[email or web address] @  RADIOLOGY:  Ct Head Wo Contrast  Result Date: 07/19/2016 CLINICAL DATA:  Initial evaluation for acute aphasia, hypertensive. EXAM: CT HEAD WITHOUT CONTRAST TECHNIQUE: Contiguous axial images were obtained from the base of the skull through the vertex without intravenous contrast. COMPARISON:  Prior CT from 06/03/2016. FINDINGS: Brain: Cerebral volume within normal limits. No acute intracranial hemorrhage identified. No evidence for acute large vessel territory infarct. No mass lesion, midline shift, or mass  effect. No hydrocephalus. No extra-axial fluid collection. Vascular: No hyperdense vessel. Skull: Scalp soft tissues within normal limits.  Calvarium intact. Sinuses/Orbits: The globes and orbital soft tissues within normal limits. Paranasal sinuses are clear. No significant mastoid effusion. IMPRESSION: Negative head CT.  No acute intracranial process identified. Electronically Signed   By: Rise Mu M.D.   On: 07/19/2016 06:40   Mr Brain Wo Contrast  Result Date: 07/19/2016 CLINICAL DATA:  Hypertension, smoker.  Confusion EXAM: MRI HEAD WITHOUT CONTRAST TECHNIQUE: Multiplanar, multiecho pulse sequences of the brain and surrounding structures were obtained without intravenous contrast. COMPARISON:  CT head 07/19/2016 FINDINGS: Brain: Ventricle size normal.  Cerebral volume normal. Negative for acute or chronic infarction. Normal white matter. Brainstem and cerebellum normal. Negative for hemorrhage or mass. Vascular: Normal arterial flow voids. Skull and upper cervical spine: Negative Sinuses/Orbits:  Mild mastoid effusion on the left. Mild mucosal edema in the paranasal sinuses. Normal orbit. Other: none IMPRESSION: Negative MRI of the head. Electronically Signed   By: Marlan Palau M.D.   On: 07/19/2016 11:55      Management plans discussed with the patient and she is in agreement. Stable for discharge home  Patient should follow up with pcp  CODE STATUS:     Code Status Orders        Start     Ordered   07/19/16 1002  Full code  Continuous     07/19/16 1001    Code Status History    Date Active Date Inactive Code Status Order ID Comments User Context   01/29/2015 10:46 AM 01/30/2015  7:23 PM Full Code [id number]  Crissie Figures, MD Inpatient      TOTAL TIME TAKING CARE OF THIS PATIENT: 39 minutes.    Note: This dictation was prepared with Dragon dictation along with smaller phrase technology. Any transcriptional errors that result from this process are unintentional.  Destini Cambre M.D on 07/20/2016 at 10:58 AM  Between 7am to 6pm - Pager - [phone] After 6pm go to [email or web address] - Social research officer, government  [facility]  Office  [phone]  CC: Primary care physician; No PCP Per Patient

## 2017-12-14 ENCOUNTER — Other Ambulatory Visit: Payer: Self-pay | Admitting: Internal Medicine

## 2017-12-15 ENCOUNTER — Other Ambulatory Visit: Payer: Self-pay | Admitting: Family Medicine

## 2017-12-15 DIAGNOSIS — E2839 Other primary ovarian failure: Secondary | ICD-10-CM

## 2018-03-07 ENCOUNTER — Other Ambulatory Visit: Payer: Self-pay

## 2018-03-07 ENCOUNTER — Emergency Department: Payer: Self-pay

## 2018-03-07 ENCOUNTER — Inpatient Hospital Stay
Admission: EM | Admit: 2018-03-07 | Discharge: 2018-03-09 | DRG: 885 | Disposition: A | Discharge status: Home / Self Care | Payer: Self-pay | Attending: Internal Medicine | Admitting: Internal Medicine

## 2018-03-07 DIAGNOSIS — F1010 Alcohol abuse, uncomplicated: Secondary | ICD-10-CM

## 2018-03-07 DIAGNOSIS — I10 Essential (primary) hypertension: Secondary | ICD-10-CM | POA: Diagnosis present

## 2018-03-07 DIAGNOSIS — Z79899 Other long term (current) drug therapy: Secondary | ICD-10-CM

## 2018-03-07 DIAGNOSIS — Z818 Family history of other mental and behavioral disorders: Secondary | ICD-10-CM

## 2018-03-07 DIAGNOSIS — Z7982 Long term (current) use of aspirin: Secondary | ICD-10-CM

## 2018-03-07 DIAGNOSIS — T512X2A Toxic effect of 2-Propanol, intentional self-harm, initial encounter: Secondary | ICD-10-CM | POA: Diagnosis present

## 2018-03-07 DIAGNOSIS — F17210 Nicotine dependence, cigarettes, uncomplicated: Secondary | ICD-10-CM | POA: Diagnosis present

## 2018-03-07 DIAGNOSIS — Z9884 Bariatric surgery status: Secondary | ICD-10-CM

## 2018-03-07 DIAGNOSIS — F1410 Cocaine abuse, uncomplicated: Secondary | ICD-10-CM | POA: Diagnosis present

## 2018-03-07 DIAGNOSIS — T511X1A Toxic effect of methanol, accidental (unintentional), initial encounter: Secondary | ICD-10-CM

## 2018-03-07 DIAGNOSIS — Z8249 Family history of ischemic heart disease and other diseases of the circulatory system: Secondary | ICD-10-CM

## 2018-03-07 DIAGNOSIS — F3130 Bipolar disorder, current episode depressed, mild or moderate severity, unspecified: Principal | ICD-10-CM

## 2018-03-07 DIAGNOSIS — Z915 Personal history of self-harm: Secondary | ICD-10-CM

## 2018-03-07 DIAGNOSIS — Z91018 Allergy to other foods: Secondary | ICD-10-CM

## 2018-03-07 DIAGNOSIS — Z9114 Patient's other noncompliance with medication regimen: Secondary | ICD-10-CM

## 2018-03-07 DIAGNOSIS — T512X1A Toxic effect of 2-Propanol, accidental (unintentional), initial encounter: Secondary | ICD-10-CM

## 2018-03-07 DIAGNOSIS — J9600 Acute respiratory failure, unspecified whether with hypoxia or hypercapnia: Secondary | ICD-10-CM | POA: Diagnosis present

## 2018-03-07 LAB — CBC WITH DIFFERENTIAL/PLATELET
BASOS ABS: 0.1 10*3/uL (ref 0–0.1)
BASOS PCT: 1 %
EOS ABS: 0 10*3/uL (ref 0–0.7)
Eosinophils Relative: 0 %
HCT: 41.8 % (ref 35.0–47.0)
Hemoglobin: 14 g/dL (ref 12.0–16.0)
Lymphocytes Relative: 42 %
Lymphs Abs: 3.1 10*3/uL (ref 1.0–3.6)
MCH: 25.4 pg — ABNORMAL LOW (ref 26.0–34.0)
MCHC: 33.5 g/dL (ref 32.0–36.0)
MCV: 75.7 fL — ABNORMAL LOW (ref 80.0–100.0)
MONOS PCT: 11 %
Monocytes Absolute: 0.8 10*3/uL (ref 0.2–0.9)
NEUTROS ABS: 3.5 10*3/uL (ref 1.4–6.5)
Neutrophils Relative %: 46 %
Platelets: 256 10*3/uL (ref 150–440)
RBC: 5.53 MIL/uL — ABNORMAL HIGH (ref 3.80–5.20)
RDW: 14.4 % (ref 11.5–14.5)
WBC: 7.6 10*3/uL (ref 3.6–11.0)

## 2018-03-07 MED ORDER — FENTANYL 2500MCG IN NS 250ML (10MCG/ML) PREMIX INFUSION
100.0000 ug/h | INTRAVENOUS | Status: DC
Start: 2018-03-08 — End: 2018-03-08
  Administered 2018-03-08: 25 ug/h via INTRAVENOUS
  Administered 2018-03-08: 200 ug/h via INTRAVENOUS
  Filled 2018-03-07: qty 250

## 2018-03-07 MED ORDER — SODIUM CHLORIDE 0.9 % IV BOLUS
1000.0000 mL | Freq: Once | INTRAVENOUS | Status: AC
  Administered 2018-03-07: 1000 mL via INTRAVENOUS

## 2018-03-07 MED ORDER — PROPOFOL 1000 MG/100ML IV EMUL
INTRAVENOUS | Status: AC
  Administered 2018-03-07: 20 ug/kg/min via INTRAVENOUS
  Filled 2018-03-07: qty 100

## 2018-03-07 MED ORDER — ROCURONIUM BROMIDE 50 MG/5ML IV SOLN
100.0000 mg | Freq: Once | INTRAVENOUS | Status: AC
  Administered 2018-03-07: 100 mg via INTRAVENOUS

## 2018-03-07 MED ORDER — KETAMINE HCL 10 MG/ML IJ SOLN
100.0000 mg | Freq: Once | INTRAMUSCULAR | Status: AC
  Administered 2018-03-07: 100 mg via INTRAVENOUS

## 2018-03-07 MED ORDER — PROPOFOL 1000 MG/100ML IV EMUL
5.0000 ug/kg/min | Freq: Once | INTRAVENOUS | Status: AC
  Administered 2018-03-07: 20 ug/kg/min via INTRAVENOUS

## 2018-03-07 NOTE — ED Notes (Addendum)
Patient admitted to drinking 2 bottles of rubbing alcohol through out the day. And states she was trying to hurt herself. MD at bedside to intubate. Patient Sinus tach on monitor.

## 2018-03-07 NOTE — ED Triage Notes (Signed)
Pt states she drank 2 bottles of rubbing alcohol throughout the day with the intention of harming herself. Pt denies any other drug use, pt very tearful in triage.
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
## 2018-03-08 DIAGNOSIS — T512X1A Toxic effect of 2-Propanol, accidental (unintentional), initial encounter: Secondary | ICD-10-CM

## 2018-03-08 DIAGNOSIS — F1010 Alcohol abuse, uncomplicated: Secondary | ICD-10-CM

## 2018-03-08 DIAGNOSIS — J9600 Acute respiratory failure, unspecified whether with hypoxia or hypercapnia: Secondary | ICD-10-CM | POA: Diagnosis present

## 2018-03-08 DIAGNOSIS — F3130 Bipolar disorder, current episode depressed, mild or moderate severity, unspecified: Principal | ICD-10-CM

## 2018-03-08 DIAGNOSIS — T511X1A Toxic effect of methanol, accidental (unintentional), initial encounter: Secondary | ICD-10-CM

## 2018-03-08 LAB — BLOOD GAS, VENOUS
Acid-base deficit: 0.1 mmol/L (ref 0.0–2.0)
BICARBONATE: 20.9 mmol/L (ref 20.0–28.0)
O2 Saturation: 83.9 %
PCO2 VEN: 25 mmHg — AB (ref 44.0–60.0)
PH VEN: 7.53 — AB (ref 7.250–7.430)
PO2 VEN: 42 mmHg (ref 32.0–45.0)
Patient temperature: 37

## 2018-03-08 LAB — ACETAMINOPHEN LEVEL

## 2018-03-08 LAB — COMPREHENSIVE METABOLIC PANEL
ALT: 21 U/L (ref 0–44)
ALT: 17 U/L (ref 0–44)
ANION GAP: 10 (ref 5–15)
AST: 38 U/L (ref 15–41)
AST: 25 U/L (ref 15–41)
Albumin: 4.4 g/dL (ref 3.5–5.0)
Albumin: 3.4 g/dL — ABNORMAL LOW (ref 3.5–5.0)
Alkaline Phosphatase: 54 U/L (ref 38–126)
Alkaline Phosphatase: 41 U/L (ref 38–126)
Anion gap: 14 (ref 5–15)
BILIRUBIN TOTAL: 1.1 mg/dL (ref 0.3–1.2)
BILIRUBIN TOTAL: 0.4 mg/dL (ref 0.3–1.2)
BUN: 14 mg/dL (ref 6–20)
BUN: 12 mg/dL (ref 6–20)
CHLORIDE: 104 mmol/L (ref 98–111)
CHLORIDE: 109 mmol/L (ref 98–111)
CO2: 21 mmol/L — ABNORMAL LOW (ref 22–32)
CO2: 22 mmol/L (ref 22–32)
CREATININE: 1.34 mg/dL — AB (ref 0.44–1.00)
Calcium: 9.1 mg/dL (ref 8.9–10.3)
Calcium: 7.7 mg/dL — ABNORMAL LOW (ref 8.9–10.3)
Creatinine, Ser: 1.06 mg/dL — ABNORMAL HIGH (ref 0.44–1.00)
GFR calc Af Amer: >60 mL/min (ref >60)
GFR calc non Af Amer: 59 mL/min — ABNORMAL LOW (ref >60)
GFR, EST AFRICAN AMERICAN: 52 mL/min — AB (ref >60)
GFR, EST NON AFRICAN AMERICAN: 45 mL/min — AB (ref >60)
Glucose, Bld: 215 mg/dL — ABNORMAL HIGH (ref 70–99)
Glucose, Bld: 157 mg/dL — ABNORMAL HIGH (ref 70–99)
POTASSIUM: 3.2 mmol/L — AB (ref 3.5–5.1)
Potassium: 3.5 mmol/L (ref 3.5–5.1)
Sodium: 139 mmol/L (ref 135–145)
Sodium: 141 mmol/L (ref 135–145)
TOTAL PROTEIN: 7.8 g/dL (ref 6.5–8.1)
TOTAL PROTEIN: 5.9 g/dL — AB (ref 6.5–8.1)

## 2018-03-08 LAB — URINE DRUG SCREEN, QUALITATIVE (ARMC ONLY)
AMPHETAMINES, UR SCREEN: NOT DETECTED
Benzodiazepine, Ur Scrn: NOT DETECTED
COCAINE METABOLITE, UR ~~LOC~~: POSITIVE — AB
Cannabinoid 50 Ng, Ur ~~LOC~~: POSITIVE — AB
MDMA (ECSTASY) UR SCREEN: NOT DETECTED
METHADONE SCREEN, URINE: NOT DETECTED
OPIATE, UR SCREEN: NOT DETECTED
Phencyclidine (PCP) Ur S: NOT DETECTED
TRICYCLIC, UR SCREEN: NOT DETECTED

## 2018-03-08 LAB — CK: Total CK: 341 U/L — ABNORMAL HIGH (ref 38–234)

## 2018-03-08 LAB — BETA-HYDROXYBUTYRIC ACID: BETA-HYDROXYBUTYRIC ACID: 2.34 mmol/L — AB (ref 0.05–0.27)

## 2018-03-08 LAB — CBC
HEMATOCRIT: 33.3 % — AB (ref 35.0–47.0)
Hemoglobin: 11.2 g/dL — ABNORMAL LOW (ref 12.0–16.0)
MCH: 25.5 pg — ABNORMAL LOW (ref 26.0–34.0)
MCHC: 33.6 g/dL (ref 32.0–36.0)
MCV: 75.7 fL — ABNORMAL LOW (ref 80.0–100.0)
PLATELETS: 199 10*3/uL (ref 150–440)
RBC: 4.39 MIL/uL (ref 3.80–5.20)
RDW: 14 % (ref 11.5–14.5)
WBC: 5.6 10*3/uL (ref 3.6–11.0)

## 2018-03-08 LAB — URINALYSIS, COMPLETE (UACMP) WITH MICROSCOPIC
BILIRUBIN URINE: NEGATIVE
Glucose, UA: NEGATIVE mg/dL
Hgb urine dipstick: NEGATIVE
Ketones, ur: 80 mg/dL — AB
Leukocytes, UA: NEGATIVE
Nitrite: NEGATIVE
PH: 6 (ref 5.0–8.0)
Protein, ur: 100 mg/dL — AB
SPECIFIC GRAVITY, URINE: 1.026 (ref 1.005–1.030)

## 2018-03-08 LAB — GLUCOSE, CAPILLARY
Glucose-Capillary: 177 mg/dL — ABNORMAL HIGH (ref 70–99)
Glucose-Capillary: 168 mg/dL — ABNORMAL HIGH (ref 70–99)

## 2018-03-08 LAB — SALICYLATE LEVEL: Salicylate Lvl: <7 mg/dL (ref 2.8–30.0)

## 2018-03-08 LAB — LIPASE, BLOOD: Lipase: 50 U/L (ref 11–51)

## 2018-03-08 LAB — TRIGLYCERIDES: Triglycerides: 96 mg/dL (ref <150)

## 2018-03-08 LAB — ETHANOL

## 2018-03-08 LAB — MRSA PCR SCREENING: MRSA BY PCR: NEGATIVE

## 2018-03-08 MED ORDER — FENTANYL CITRATE (PF) 100 MCG/2ML IJ SOLN: 100.0000 ug | INTRAMUSCULAR | Status: DC | PRN

## 2018-03-08 MED ORDER — SODIUM CHLORIDE 0.9 % IV BOLUS: 1000.0000 mL | Freq: Once | INTRAVENOUS | Status: DC

## 2018-03-08 MED ORDER — ONDANSETRON HCL 4 MG PO TABS: 4.0000 mg | ORAL_TABLET | Freq: Four times a day (QID) | ORAL | Status: DC | PRN

## 2018-03-08 MED ORDER — VITAMIN B-1 100 MG PO TABS
100.0000 mg | ORAL_TABLET | Freq: Every day | ORAL | Status: DC
  Administered 2018-03-08 – 2018-03-09 (×2): 100 mg via ORAL
  Filled 2018-03-08 (×2): qty 1

## 2018-03-08 MED ORDER — METOPROLOL TARTRATE 5 MG/5ML IV SOLN: 2.5000 mg | Freq: Four times a day (QID) | INTRAVENOUS | Status: DC | PRN

## 2018-03-08 MED ORDER — DOCUSATE SODIUM 100 MG PO CAPS
100.0000 mg | ORAL_CAPSULE | Freq: Two times a day (BID) | ORAL | Status: DC
  Administered 2018-03-08: 100 mg via ORAL
  Filled 2018-03-08 (×2): qty 1

## 2018-03-08 MED ORDER — DIVALPROEX SODIUM ER 500 MG PO TB24
500.0000 mg | ORAL_TABLET | Freq: Every day | ORAL | Status: DC
  Administered 2018-03-08 – 2018-03-09 (×2): 500 mg via ORAL
  Filled 2018-03-08 (×2): qty 1

## 2018-03-08 MED ORDER — HYDROMORPHONE HCL 1 MG/ML IJ SOLN
2.0000 mg | Freq: Once | INTRAMUSCULAR | Status: AC
  Administered 2018-03-08: 2 mg via INTRAVENOUS

## 2018-03-08 MED ORDER — FENTANYL BOLUS VIA INFUSION
40.0000 ug | Freq: Once | INTRAVENOUS | Status: AC
  Administered 2018-03-08: 40 ug via INTRAVENOUS

## 2018-03-08 MED ORDER — ACETAMINOPHEN 650 MG RE SUPP: 650.0000 mg | Freq: Four times a day (QID) | RECTAL | Status: DC | PRN

## 2018-03-08 MED ORDER — BISACODYL 5 MG PO TBEC: 5.0000 mg | DELAYED_RELEASE_TABLET | Freq: Every day | ORAL | Status: DC | PRN

## 2018-03-08 MED ORDER — IPRATROPIUM-ALBUTEROL 0.5-2.5 (3) MG/3ML IN SOLN: 3.0000 mL | RESPIRATORY_TRACT | Status: DC | PRN

## 2018-03-08 MED ORDER — LORAZEPAM 2 MG/ML IJ SOLN
1.0000 mg | INTRAMUSCULAR | Status: DC | PRN
  Filled 2018-03-08: qty 1

## 2018-03-08 MED ORDER — FOLIC ACID 1 MG PO TABS
1.0000 mg | ORAL_TABLET | Freq: Every day | ORAL | Status: DC
  Administered 2018-03-08 – 2018-03-09 (×2): 1 mg via ORAL
  Filled 2018-03-08: qty 1

## 2018-03-08 MED ORDER — FLUOXETINE HCL 20 MG PO CAPS
40.0000 mg | ORAL_CAPSULE | Freq: Every day | ORAL | Status: DC
  Administered 2018-03-08 – 2018-03-09 (×2): 40 mg via ORAL
  Filled 2018-03-08 (×2): qty 2

## 2018-03-08 MED ORDER — ACETAMINOPHEN 325 MG PO TABS
650.0000 mg | ORAL_TABLET | Freq: Four times a day (QID) | ORAL | Status: DC | PRN
  Administered 2018-03-08: 650 mg via ORAL
  Filled 2018-03-08: qty 2

## 2018-03-08 MED ORDER — DEXMEDETOMIDINE HCL IN NACL 400 MCG/100ML IV SOLN
0.4000 ug/kg/h | INTRAVENOUS | Status: DC
  Administered 2018-03-08: 0.4 ug/kg/h via INTRAVENOUS
  Filled 2018-03-08: qty 100

## 2018-03-08 MED ORDER — POTASSIUM CHLORIDE 20 MEQ PO PACK
60.0000 meq | PACK | Freq: Once | ORAL | Status: AC
  Administered 2018-03-08: 60 meq via ORAL
  Filled 2018-03-08: qty 3

## 2018-03-08 MED ORDER — HYDROMORPHONE HCL 1 MG/ML IJ SOLN
INTRAMUSCULAR | Status: AC
  Filled 2018-03-08: qty 2

## 2018-03-08 MED ORDER — HEPARIN SODIUM (PORCINE) 5000 UNIT/ML IJ SOLN
5000.0000 [IU] | Freq: Three times a day (TID) | INTRAMUSCULAR | Status: DC
  Administered 2018-03-08 – 2018-03-09 (×4): 5000 [IU] via SUBCUTANEOUS
  Filled 2018-03-08 (×4): qty 1

## 2018-03-08 MED ORDER — SODIUM CHLORIDE 0.9 % IV BOLUS
1000.0000 mL | Freq: Once | INTRAVENOUS | Status: AC
  Administered 2018-03-08: 1000 mL via INTRAVENOUS

## 2018-03-08 MED ORDER — ADULT MULTIVITAMIN W/MINERALS CH
1.0000 | ORAL_TABLET | Freq: Every day | ORAL | Status: DC
  Administered 2018-03-08 – 2018-03-09 (×2): 1 via ORAL
  Filled 2018-03-08 (×2): qty 1

## 2018-03-08 MED ORDER — PROPOFOL 10 MG/ML IV BOLUS
40.0000 mg | Freq: Once | INTRAVENOUS | Status: AC
  Administered 2018-03-08: 40 mg via INTRAVENOUS

## 2018-03-08 MED ORDER — FAMOTIDINE IN NACL 20-0.9 MG/50ML-% IV SOLN
20.0000 mg | INTRAVENOUS | Status: DC
  Administered 2018-03-08 – 2018-03-09 (×2): 20 mg via INTRAVENOUS
  Filled 2018-03-08 (×2): qty 50

## 2018-03-08 MED ORDER — METOPROLOL TARTRATE 5 MG/5ML IV SOLN
5.0000 mg | Freq: Four times a day (QID) | INTRAVENOUS | Status: DC
  Administered 2018-03-08 – 2018-03-09 (×7): 5 mg via INTRAVENOUS
  Filled 2018-03-08 (×7): qty 5

## 2018-03-08 MED ORDER — SODIUM CHLORIDE 0.9 % IV SOLN
INTRAVENOUS | Status: DC
  Administered 2018-03-08 (×2): via INTRAVENOUS

## 2018-03-08 MED ORDER — TRAZODONE HCL 50 MG PO TABS
25.0000 mg | ORAL_TABLET | Freq: Every evening | ORAL | Status: DC | PRN
  Administered 2018-03-08: 25 mg via ORAL
  Filled 2018-03-08: qty 1

## 2018-03-08 MED ORDER — PROPOFOL 1000 MG/100ML IV EMUL
5.0000 ug/kg/min | INTRAVENOUS | Status: DC
  Administered 2018-03-08: 40 ug/kg/min via INTRAVENOUS
  Administered 2018-03-08 (×2): 20 ug/kg/min via INTRAVENOUS
  Filled 2018-03-08 (×2): qty 100

## 2018-03-08 MED ORDER — ARIPIPRAZOLE 10 MG PO TABS
10.0000 mg | ORAL_TABLET | Freq: Every day | ORAL | Status: DC
  Administered 2018-03-08 – 2018-03-09 (×2): 10 mg via ORAL
  Filled 2018-03-08 (×2): qty 1

## 2018-03-08 MED ORDER — FENTANYL CITRATE (PF) 100 MCG/2ML IJ SOLN: 200.0000 ug | Freq: Once | INTRAMUSCULAR | Status: DC

## 2018-03-08 MED ORDER — HYDROCODONE-ACETAMINOPHEN 5-325 MG PO TABS: 1.0000 | ORAL_TABLET | ORAL | Status: DC | PRN

## 2018-03-08 MED ORDER — ONDANSETRON HCL 4 MG/2ML IJ SOLN: 4.0000 mg | Freq: Four times a day (QID) | INTRAMUSCULAR | Status: DC | PRN

## 2018-03-08 NOTE — Progress Notes (Signed)
Patient extubated, on RA, vital signs stable at this time. Will continue to monitor.

## 2018-03-08 NOTE — Progress Notes (Signed)
Pt placed on SBT on settings of 15/5 at 0825 hrs

## 2018-03-08 NOTE — Consult Note (Signed)
Name: Tara Lamb MRN: [id number] DOB: 1966-04-12    ADMISSION DATE:  03/07/2018 CONSULTATION DATE: 03/07/2018  REFERRING MD : Dr. Duane Boston   CHIEF COMPLAINT: Suicide Attempt  BRIEF PATIENT DESCRIPTION:  52 yo female admitted following ingestion of 2 bottles of rubbing alcohol during suicide attempt required mechanical intubation for airway protection   SIGNIFICANT EVENTS/STUDIES:  07/4 Pt admitted to ICU mechanically intubated   HISTORY OF PRESENT ILLNESS:   This is a 52 yo female with a PMH of Current Everyday Smoker, Precordial Chest Pain, Medication Noncompliance, Polysubstance and ETOH Abuse, Essential HTN, and Bipolar 1 Disorder.  She presented to [facility] ER on 07/3 after drinking 2 bottles of rubbing alcohol throughout the day in an attempt to harm herself.  In the ER she required mechanical intubation for airway protection.  Lab results revealed glucose 215, creatinine 1.34, ck 341, acetaminophen level <03, salicylate level <2.1, alcohol ethyl <10, and venous abg pH 7.53, and CO2 25.  She was subsequently admitted to ICU by hospitalist team for further workup and treatment.   PAST MEDICAL HISTORY :   has a past medical history of Bipolar 1 disorder ([place]), Essential hypertension, ETOH abuse, Noncompliance, Polysubstance abuse, Precordial chest pain, and Tobacco abuse.  has a past surgical history that includes Skin graft; Cesarean section; Tubal ligation; Gastric bypass; Finger fracture surgery; and Appendectomy. Prior to Admission medications   Medication Sig Start Date End Date Taking? Authorizing Provider  amLODipine (NORVASC) 10 MG tablet Take 1 tablet (10 mg total) by mouth daily. 07/20/16  Yes Mody, Ulice Bold, MD  divalproex (DEPAKOTE ER) 500 MG 24 hr tablet Take 1 tablet (500 mg total) by mouth daily. Patient taking differently: Take 500 mg by mouth 2 (two) times daily.  07/20/16  Yes Mody, Ulice Bold, MD  FLUoxetine (PROZAC) 40 MG capsule Take 1 capsule (40 mg total) by mouth daily.  01/30/15  Yes Dustin Flock, MD  hydrochlorothiazide (HYDRODIURIL) 25 MG tablet Take 25 mg by mouth daily. 02/01/18  Yes [provider]  irbesartan (AVAPRO) 300 MG tablet Take 1 tablet (300 mg total) by mouth daily. 07/20/16  Yes Mody, Ulice Bold, MD  ARIPiprazole (ABILIFY) 10 MG tablet Take 1 tablet (10 mg total) by mouth daily. 01/30/15   Dustin Flock, MD  aspirin EC 81 MG EC tablet Take 1 tablet (81 mg total) by mouth daily. 01/30/15   Dustin Flock, MD  ferrous sulfate 325 (65 FE) MG tablet Take 1 tablet (325 mg total) by mouth daily with breakfast. TAKE WITH ORANGE JUICE 06/04/16   Street, [place], PA-C  hydrochlorothiazide (HYDRODIURIL) 50 MG tablet Take 1 tablet (50 mg total) by mouth daily. Patient not taking: Reported on 03/08/2018 07/20/16   Bettey Costa, MD  metoprolol (TOPROL-XL) 200 MG 24 hr tablet Take 1 tablet (200 mg total) by mouth daily. Take with or immediately following a meal. 07/20/16   Mody, Sital, MD  nicotine (NICODERM CQ - DOSED IN MG/24 HOURS) 21 mg/24hr patch Place 1 patch (21 mg total) onto the skin daily. Patient not taking: Reported on 03/08/2018 07/20/16   Bettey Costa, MD   Allergies  Allergen Reactions  . Banana Hives    FAMILY HISTORY:  family history includes CAD in her father; COPD in her mother; Diabetes in her father and mother; Heart murmur in her mother; Hypertension in her father, mother, and sister; Lupus in her sister. SOCIAL HISTORY:  reports that she has been smoking cigarettes.  She has a 7.50 pack-year smoking history. She has  never used smokeless tobacco. She reports that she drinks about 1.2 - 3.6 oz of alcohol per week. She reports that she has current or past drug history. Drugs: Marijuana and Cocaine.  REVIEW OF SYSTEMS:   Unable to assess pt intubated   SUBJECTIVE:  Unable to assess pt intubated   VITAL SIGNS: Temp:  [98.3 F (36.8 C)] 98.3 F (36.8 C) (07/03 2326) Pulse Rate:  [130-182] 130 (07/04 0010) Resp:  [7-23] 23 (07/04  0010) BP: (178-210)/(121-187) 195/121 (07/04 0010) SpO2:  [100 %] 100 % (07/04 0010) FiO2 (%):  [60 %] 60 % (07/03 2353) Weight:  [78.5 kg (173 lb)] 78.5 kg (173 lb) (07/03 2326)  PHYSICAL EXAMINATION: General: well developed, well nourished female, NAD mechanically intubated Neuro: awake, follows commands, PERRL HEENT: supple, no JVD  Cardiovascular: sinus tach, no R/G Lungs: rhonchi throughout, even, non labored  Abdomen: +BS x4, soft, obese, non distended  Musculoskeletal: normal bulk and tone, no edema  Skin: intact no rashes or lesions   Recent Labs  Lab 03/07/18 2328  NA 139  K 3.5  CL 104  CO2 21*  BUN 14  CREATININE 1.34*  GLUCOSE 215*   Recent Labs  Lab 03/07/18 2328  HGB 14.0  HCT 41.8  WBC 7.6  PLT 256   Dg Chest Portable 1 View  Result Date: 03/08/2018 CLINICAL DATA:  Post intubation EXAM: PORTABLE CHEST 1 VIEW COMPARISON:  None. FINDINGS: Endotracheal tube with tip measuring 3.2 cm above the carina. Enteric tube tip is off the field of view but below the left hemidiaphragm. Shallow inspiration. Heart size and pulmonary vascularity are normal. Lungs are clear and expanded. No blunting of costophrenic angles. No pneumothorax. IMPRESSION: Appliances appear in satisfactory position. Shallow inspiration. No evidence of active pulmonary disease. Electronically Signed   By: Lucienne Capers M.D.   On: 03/08/2018 00:16    ASSESSMENT / PLAN: Suicide attempt by ingestion of rubbing alcohol  Mechanical intubation  Acute renal failure  HTN Hyperglycemia  Hx: Current everyday smoker, Polysubstance and ETOH abuse, and Bipolar 1 disorder  P: Full vent support for now-vent settings reviewed and established  SBT once all parameters met VAP bundle implemented  Continuous telemetry monitoring  Prn hydralazine for bp management  NS '@75'  ml/hr  Trend BMP Replace electrolytes as indicated  Monitor UOP  Keep NPO for now  SUP px: iv pepcid  VTE px: subq heparin Trend  CBC  Monitor for s/sx of bleeding and transfuse for hgb <7 Trend WBC and monitor fever curve  CBG's q4hrs and SSI  Maintain RASS goal 0 to -1 Propofol gtt and prn fentanyl to maintain RASS goal  WUA daily Psychiatry consulted appreciate input  Once extubated will need sitter at bedside for suicide precautions Once extubated will need smoking and polysubstance abuse cessation counseling   Marda Stalker, [place] Pager [phone] (please enter 7 digits) PCCM Consult Pager [phone] (please enter 7 digits)

## 2018-03-08 NOTE — Progress Notes (Signed)
[facility] [place] [facility]   PATIENT NAME: Tara Lamb    MR#:  [id number]  DATE OF BIRTH:  1966-03-21  SUBJECTIVE:   Patient came in after drinking two bottles of rubbing alcohol in an attempt to commit suicide. She tells me she has bipolar and when her boyfriend doesn't bring her beer she got upset and drink some rubbing alcohol. She was intubated now extubated doing well. REVIEW OF SYSTEMS:   Review of Systems  Constitutional: Negative for chills, fever and weight loss.  HENT: Negative for ear discharge, ear pain and nosebleeds.   Eyes: Negative for blurred vision, pain and discharge.  Respiratory: Negative for sputum production, shortness of breath, wheezing and stridor.   Cardiovascular: Negative for chest pain, palpitations, orthopnea and PND.  Gastrointestinal: Negative for abdominal pain, diarrhea, nausea and vomiting.  Genitourinary: Negative for frequency and urgency.  Musculoskeletal: Negative for back pain and joint pain.  Neurological: Negative for sensory change, speech change, focal weakness and weakness.  Psychiatric/Behavioral: Positive for depression. Negative for hallucinations. The patient is not nervous/anxious.    Tolerating Diet:yes Tolerating PT: not needed  DRUG ALLERGIES:   Allergies  Allergen Reactions  . Banana Hives    VITALS:  Blood pressure 138/81, pulse 87, temperature 97.9 F (36.6 C), temperature source Oral, resp. rate 14, height 5\' 6"  (1.676 m), weight 72.4 kg (159 lb 9.8 oz), SpO2 100 %.  PHYSICAL EXAMINATION:   Physical Exam  GENERAL:  52 y.o.-year-old patient lying in the bed with no acute distress.  EYES: Pupils equal, round, reactive to light and accommodation. No scleral icterus. Extraocular muscles intact.  HEENT: Head atraumatic, normocephalic. Oropharynx and nasopharynx clear.  NECK:  Supple, no jugular venous distention. No thyroid enlargement, no tenderness.  LUNGS: Normal breath sounds  bilaterally, no wheezing, rales, rhonchi. No use of accessory muscles of respiration.  CARDIOVASCULAR: S1, S2 normal. No murmurs, rubs, or gallops.  ABDOMEN: Soft, nontender, nondistended. Bowel sounds present. No organomegaly or mass.  EXTREMITIES: No cyanosis, clubbing or edema b/l.    NEUROLOGIC: Cranial nerves II through XII are intact. No focal Motor or sensory deficits b/l.   PSYCHIATRIC:  patient is alert and oriented x 3.  SKIN: No obvious rash, lesion, or ulcer.   LABORATORY PANEL:  CBC Recent Labs  Lab 03/08/18 0510  WBC 5.6  HGB 11.2*  HCT 33.3*  PLT 199    Chemistries  Recent Labs  Lab 03/08/18 0510  NA 141  K 3.2*  CL 109  CO2 22  GLUCOSE 157*  BUN 12  CREATININE 1.06*  CALCIUM 7.7*  AST 25  ALT 17  ALKPHOS 41  BILITOT 0.4   Cardiac Enzymes No results for input(s): TROPONINI in the last 168 hours. RADIOLOGY:  Dg Chest Portable 1 View  Result Date: 03/08/2018 CLINICAL DATA:  Post intubation EXAM: PORTABLE CHEST 1 VIEW COMPARISON:  None. FINDINGS: Endotracheal tube with tip measuring 3.2 cm above the carina. Enteric tube tip is off the field of view but below the left hemidiaphragm. Shallow inspiration. Heart size and pulmonary vascularity are normal. Lungs are clear and expanded. No blunting of costophrenic angles. No pneumothorax. IMPRESSION: Appliances appear in satisfactory position. Shallow inspiration. No evidence of active pulmonary disease. Electronically Signed   By: Burman NievesWilliam  Stevens M.D.   On: 03/08/2018 00:16   ASSESSMENT AND PLAN:    Tara FinnerLisa Maxim  is a 52 y.o. female with a known history of bipolar disorder, alcohol abuse, hypertension, tobacco abuse,  cocaine and marijuana abuse.She presented to emergency room after ingesting 2 bottles of rubbing alcohol, earlier in the day, during suicide attempt.  While in the emergency room, patient became unstable and required mechanical intubation for airway protection.   1.  Suicide attempt with ingestion of  rubbing alcohol -patient was intubated for every protection. She is now extubated. Doing well. Tolerating diet. -Watch for alcohol withdrawal. -Psychiatry consultation pending.  2.  Acute respiratory failure now resolved. Patient was extubated.  3.  Polysubstance abuse -chronic alcohol, tobacco, cocaine and marijuana abuse  4.  Hypertension -resume home meds  5.  Bipolar disorder -defer to psychiatry for input     Case discussed with Care Management/Social Worker. Management plans discussed with the patient, family and they are in agreement.  CODE STATUS: full  DVT Prophylaxis: lovenox  TOTAL TIME TAKING CARE OF THIS PATIENT: *30* minutes.  >50% time spent on counselling and coordination of care  POSSIBLE D/C IN *1-2** DAYS, DEPENDING ON CLINICAL CONDITION.  Note: This dictation was prepared with Dragon dictation along with smaller phrase technology. Any transcriptional errors that result from this process are unintentional.  Enedina Lamb M.D on 03/08/2018 at 12:49 PM  Between 7am to 6pm - Pager - [phone]  After 6pm go to [email or web address] - password [email or web address]  [facility]  Office  [phone]  CC: Primary care physician; Patient, No Pcp PerPatient ID: Tara Lamb, female   DOB: 1965/09/07, 52 y.o.   MRN: [id number]
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
## 2018-03-08 NOTE — Progress Notes (Signed)
Chaplain engaged patient who began crying immediately. Chaplain knelt down and held the patient's hand while she cycled through several bouts of crying. As she cried, she repeated, "Oh Jesus." Chaplain assured the patient that God was in this moment; this appeared to calm the patient. Chaplain prayed for God's love, compassion, and mercy; the patient responded to each phrase with "Amen." Patient thanked Chaplain profusely for the prayer and visit. Chaplain's assessment:  The patient is experiencing shame, remorse, and seperation from the source of her faith. Patient is in emotional and spiritual pain and can benefit from continuing pastoral visits.
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
## 2018-03-09 LAB — COMPREHENSIVE METABOLIC PANEL
ALBUMIN: 3.1 g/dL — AB (ref 3.5–5.0)
ALK PHOS: 40 U/L (ref 38–126)
ALT: 32 U/L (ref 0–44)
AST: 55 U/L — ABNORMAL HIGH (ref 15–41)
Anion gap: 6 (ref 5–15)
BUN: 9 mg/dL (ref 6–20)
CHLORIDE: 110 mmol/L (ref 98–111)
CO2: 22 mmol/L (ref 22–32)
CREATININE: 0.73 mg/dL (ref 0.44–1.00)
Calcium: 8.3 mg/dL — ABNORMAL LOW (ref 8.9–10.3)
GFR calc non Af Amer: >60 mL/min (ref >60)
GLUCOSE: 112 mg/dL — AB (ref 70–99)
Potassium: 3.8 mmol/L (ref 3.5–5.1)
SODIUM: 138 mmol/L (ref 135–145)
Total Bilirubin: 0.5 mg/dL (ref 0.3–1.2)
Total Protein: 5.7 g/dL — ABNORMAL LOW (ref 6.5–8.1)

## 2018-03-09 LAB — PHOSPHORUS: PHOSPHORUS: 3.3 mg/dL (ref 2.5–4.6)

## 2018-03-09 LAB — OSMOLALITY: OSMOLALITY: 331 mosm/kg — AB (ref 275–295)

## 2018-03-09 LAB — GLUCOSE, CAPILLARY: Glucose-Capillary: 99 mg/dL (ref 70–99)

## 2018-03-09 LAB — MAGNESIUM: Magnesium: 2 mg/dL (ref 1.7–2.4)

## 2018-03-09 MED ORDER — ADULT MULTIVITAMIN W/MINERALS CH: 1.0000 | ORAL_TABLET | Freq: Every day | ORAL | 1 refills | Status: DC

## 2018-03-09 MED ORDER — METOPROLOL SUCCINATE ER 100 MG PO TB24: 100.0000 mg | ORAL_TABLET | Freq: Every day | ORAL | 0 refills | Status: DC

## 2018-03-09 NOTE — Discharge Summary (Signed)
[facility] [place] at [facility]   PATIENT NAME: Tara Lamb    MR#:  [id number]  DATE OF BIRTH:  09/21/1965  DATE OF ADMISSION:  03/07/2018 ADMITTING PHYSICIAN: Cammy Copa, MD  DATE OF DISCHARGE: 03/09/2018  PRIMARY CARE PHYSICIAN: System, Pcp Not In    ADMISSION DIAGNOSIS:  Ingestion  DISCHARGE DIAGNOSIS:  Ingestion of Rubbing alcohol Bipolar disroder HTN  SECONDARY DIAGNOSIS:   Past Medical History:  Diagnosis Date  . Bipolar 1 disorder (HCC)    a. off meds x several years.  . Essential hypertension   . ETOH abuse    a. at least 2 beers daily, drinks liquor heavily every other weekend.  . Noncompliance   . Polysubstance abuse    a. last used marijuana 01/2015, last used cocaine 09/2014.  [name] Precordial chest pain   . Tobacco abuse    a. 3-4 cigarettes daily x 30 yrs.    HOSPITAL COURSE:   Tara Lamb a52 y.o.femalewith a known history of bipolar disorder, alcohol abuse, hypertension, tobacco abuse, cocaine and marijuana abuse.She presented to emergency room after ingesting 2 bottles of rubbing alcohol, earlier in the day, during suicide attempt. While in the emergency room, patient became unstable and required mechanical intubation for airway protection.   1.Suspected Suicide attempt withingestion of rubbing alcohol -patient was intubated for every protection. She is now extubated. Doing well. Tolerating diet. -Watch for alcohol withdrawal. -Psychiatry consultation appreciated. Per Dr. Toni Amend okay to go home. Resume psych medications. Unclear whether patient really had intent to harm herself by drinking rubbing alcohol.\ -Denies any suicidal ideation today. Appears to be in good mood.  2.Acute respiratory failure now resolved. Patient was extubated.  3.Polysubstance abuse -chronic alcohol, tobacco, cocaine and marijuana abuse  4.Hypertension -resume home meds -discontinued hydrochlorothiazide. And I decrease dose  of metoprolol 200 mg daily. She will continue her amlodipine and ARB -patient has history of noncompliance.  5.Bipolar disorder -defer to psychiatry for input  Okay from psychiatry standpoint to go home. Discussed with care management. Patient will need to primary care physician.   CONSULTS OBTAINED:  Treatment Team:  Pccm, Raymond Gurney, MD Clapacs, Jackquline Denmark, MD  DRUG ALLERGIES:   Allergies  Allergen Reactions  . Banana Hives    DISCHARGE MEDICATIONS:   Allergies as of 03/09/2018      Reactions   Banana Hives      Medication List    STOP taking these medications   ARIPiprazole 10 MG tablet Commonly known as:  ABILIFY   aspirin 81 MG EC tablet   ferrous sulfate 325 (65 FE) MG tablet   hydrochlorothiazide 25 MG tablet Commonly known as:  HYDRODIURIL   hydrochlorothiazide 50 MG tablet Commonly known as:  HYDRODIURIL   nicotine 21 mg/24hr patch Commonly known as:  NICODERM CQ - dosed in mg/24 hours     TAKE these medications   amLODipine 10 MG tablet Commonly known as:  NORVASC Take 1 tablet (10 mg total) by mouth daily.   divalproex 500 MG 24 hr tablet Commonly known as:  DEPAKOTE ER Take 1 tablet (500 mg total) by mouth daily. What changed:  when to take this   FLUoxetine 40 MG capsule Commonly known as:  PROZAC Take 1 capsule (40 mg total) by mouth daily.   irbesartan 300 MG tablet Commonly known as:  AVAPRO Take 1 tablet (300 mg total) by mouth daily.   metoprolol succinate 100 MG 24 hr tablet Commonly known as:  TOPROL-XL Take 1 tablet (100  mg total) by mouth daily. Take with or immediately following a meal. What changed:    medication strength  how much to take   multivitamin with minerals Tabs tablet Take 1 tablet by mouth daily. Start taking on:  03/10/2018       If you experience worsening of your admission symptoms, develop shortness of breath, life threatening emergency, suicidal or homicidal thoughts you must seek medical  attention immediately by calling 911 or calling your MD immediately  if symptoms less severe.  You Must read complete instructions/literature along with all the possible adverse reactions/side effects for all the Medicines you take and that have been prescribed to you. Take any new Medicines after you have completely understood and accept all the possible adverse reactions/side effects.   Please note  You were cared for by a hospitalist during your hospital stay. If you have any questions about your discharge medications or the care you received while you were in the hospital after you are discharged, you can call the unit and asked to speak with the hospitalist on call if the hospitalist that took care of you is not available. Once you are discharged, your primary care physician will handle any further medical issues. Please note that NO REFILLS for any discharge medications will be authorized once you are discharged, as it is imperative that you return to your primary care physician (or establish a relationship with a primary care physician if you do not have one) for your aftercare needs so that they can reassess your need for medications and monitor your lab values. Today   SUBJECTIVE    New complaints. Feeling okay. VITAL SIGNS:  Blood pressure (!) 152/108, pulse 86, temperature 98.6 F (37 C), temperature source Oral, resp. rate 16, height 5\' 6"  (1.676 m), weight 72.4 kg (159 lb 9.8 oz), SpO2 100 %.  I/O:    Intake/Output Summary (Last 24 hours) at 03/09/2018 1333 Last data filed at 03/09/2018 1006 Gross per 24 hour  Intake 876.25 ml  Output 87 ml  Net 789.25 ml    PHYSICAL EXAMINATION:  GENERAL:  52 y.o.-year-old patient lying in the bed with no acute distress.  EYES: Pupils equal, round, reactive to light and accommodation. No scleral icterus. Extraocular muscles intact.  HEENT: Head atraumatic, normocephalic. Oropharynx and nasopharynx clear.  NECK:  Supple, no jugular venous  distention. No thyroid enlargement, no tenderness.  LUNGS: Normal breath sounds bilaterally, no wheezing, rales,rhonchi or crepitation. No use of accessory muscles of respiration.  CARDIOVASCULAR: S1, S2 normal. No murmurs, rubs, or gallops.  ABDOMEN: Soft, non-tender, non-distended. Bowel sounds present. No organomegaly or mass.  EXTREMITIES: No pedal edema, cyanosis, or clubbing.  NEUROLOGIC: Cranial nerves II through XII are intact. Muscle strength 5/5 in all extremities. Sensation intact. Gait not checked.  PSYCHIATRIC: The patient is alert and oriented x 3.  SKIN: No obvious rash, lesion, or ulcer.   DATA REVIEW:   CBC  Recent Labs  Lab 03/08/18 0510  WBC 5.6  HGB 11.2*  HCT 33.3*  PLT 199    Chemistries  Recent Labs  Lab 03/09/18 0445  NA 138  K 3.8  CL 110  CO2 22  GLUCOSE 112*  BUN 9  CREATININE 0.73  CALCIUM 8.3*  MG 2.0  AST 55*  ALT 32  ALKPHOS 40  BILITOT 0.5    Microbiology Results   Recent Results (from the past 240 hour(s))  MRSA PCR Screening     Status: None   Collection Time:  03/08/18  1:42 AM  Result Value Ref Range Status   MRSA by PCR NEGATIVE NEGATIVE Final    Comment:        The GeneXpert MRSA Assay (FDA approved for NASAL specimens only), is one component of a comprehensive MRSA colonization surveillance program. It is not intended to diagnose MRSA infection nor to guide or monitor treatment for MRSA infections. Performed at [facility], [address] [place], [place] [address]     RADIOLOGY:  Dg Chest Portable 1 View  Result Date: 03/08/2018 CLINICAL DATA:  Post intubation EXAM: PORTABLE CHEST 1 VIEW COMPARISON:  None. FINDINGS: Endotracheal tube with tip measuring 3.2 cm above the carina. Enteric tube tip is off the field of view but below the left hemidiaphragm. Shallow inspiration. Heart size and pulmonary vascularity are normal. Lungs are clear and expanded. No blunting of costophrenic angles. No pneumothorax.  IMPRESSION: Appliances appear in satisfactory position. Shallow inspiration. No evidence of active pulmonary disease. Electronically Signed   By: Burman NievesWilliam  Stevens M.D.   On: 03/08/2018 00:16     Management plans discussed with the patient, family and they are in agreement.  CODE STATUS:     Code Status Orders  (From admission, onward)        Start     Ordered   03/08/18 0145  Full code  Continuous     03/08/18 0144    Code Status History    Date Active Date Inactive Code Status Order ID Comments User Context   07/19/2016 1001 07/20/2016 1409 Full Code [id number]  Adrian SaranMody, Sital, MD Inpatient   01/29/2015 1046 01/30/2015 1923 Full Code [id number]  Crissie Figureseddy, Edavally N, MD Inpatient      TOTAL TIME TAKING CARE OF THIS PATIENT: 40 minutes.    Enedina FinnerSona Sriya Kroeze M.D on 03/09/2018 at 1:33 PM  Between 7am to 6pm - Pager - [phone] After 6pm go to [email or web address] - password [email or web address]  [facility]  Office  [phone]  CC: Primary care physician; System, Pcp Not In

## 2018-03-09 NOTE — Discharge Instructions (Signed)
Get established with PCP in the area
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
## 2018-03-09 NOTE — Progress Notes (Signed)
VS stable Successful extubation  Ok to transfer to gen med floor Psych eval follow up    Lucie LeatherKurian David Kadra Kohan, M.D.  [facility]  Medical Director [facility] Medical Director [facility]

## 2018-03-09 NOTE — Progress Notes (Signed)
Patient ID: Tara Lamb, female   DOB: 1966/01/29, 52 y.o.   MRN: [id number] Per RN Dr clapacs ok for pt to go home

## 2018-03-09 NOTE — Progress Notes (Signed)
RNCM received call from primary MD regarding concern over affordability for medications and the absence of a PCP. RNCM spoke with patient who tells me that she sees a clinic [facility] by [facility] [place] that serves as her PCP. [facility] I have personally reviewed her medication list and confirmed they are all on the [facility] list at [facility]. Provided application to the [facility] as well as a list of other clinics in the area. No further RNCM needs. Will sign off.

## 2018-03-09 NOTE — Progress Notes (Signed)
Patient discharged home with discharge instructions provided and prescription medications. Patient had no further questions about discharge instructions at this time. 2 PIVS removed before discharge.

## 2018-03-10 LAB — HIV ANTIBODY (ROUTINE TESTING W REFLEX): HIV Screen 4th Generation wRfx: NONREACTIVE

## 2018-03-13 LAB — VOLATILES,BLD-ACETONE,ETHANOL,ISOPROP,METHANOL
ACETONE, BLOOD: 0.186 % — AB (ref 0.000–0.010)
Ethanol, blood: NEGATIVE % (ref 0.000–0.010)
Isopropanol, blood: NEGATIVE % (ref 0.000–0.010)
Methanol, blood: NEGATIVE % (ref 0.000–0.010)

## 2018-05-12 ENCOUNTER — Ambulatory Visit: Payer: Self-pay | Admitting: Internal Medicine

## 2018-05-26 ENCOUNTER — Ambulatory Visit: Payer: Self-pay | Admitting: Adult Health

## 2018-06-09 ENCOUNTER — Ambulatory Visit: Payer: Self-pay | Admitting: Cardiovascular Disease

## 2018-06-23 ENCOUNTER — Ambulatory Visit: Payer: Self-pay | Admitting: Internal Medicine

## 2018-07-07 ENCOUNTER — Encounter: Payer: Self-pay | Admitting: Adult Health

## 2018-07-07 ENCOUNTER — Ambulatory Visit: Payer: Self-pay | Admitting: Internal Medicine

## 2018-07-07 VITALS — BP 133/98 | HR 93 | Temp 97.4°F | Wt 171.0 lb

## 2018-07-07 DIAGNOSIS — K219 Gastro-esophageal reflux disease without esophagitis: Secondary | ICD-10-CM

## 2018-07-07 MED ORDER — PROMETHAZINE HCL 12.5 MG PO TABS: 12.5000 mg | ORAL_TABLET | Freq: Three times a day (TID) | ORAL | 0 refills | Status: DC | PRN

## 2018-07-07 NOTE — Progress Notes (Signed)
  Subjective:     Patient ID: Tara Lamb, female   DOB: 06-Feb-1966, 52 y.o.   MRN: [id number]  Emesis   This is a recurrent problem. The current episode started today. The problem occurs 2 to 4 times per day. The emesis has an appearance of stomach contents. There has been no fever. Associated symptoms include abdominal pain. Pertinent negatives include no diarrhea. She has tried nothing for the symptoms.  She c/o pmh of GERD.   Review of Systems  Gastrointestinal: Positive for abdominal pain and vomiting. Negative for diarrhea.  Epigastric in location. BP high today but admits to running out of her meds for several weeks as she'Tara Lamb trying to obtain subsidized rx from [facility].     Objective:   Physical Exam  Constitutional: She appears well-nourished. No distress.  HENT:  Head: Normocephalic.  Eyes: Pupils are equal, round, and reactive to light. No scleral icterus.  Cardiovascular: Normal rate, regular rhythm and normal heart sounds.  Pulmonary/Chest: Breath sounds normal. She has no rales.  Abdominal: Soft. There is tenderness in the epigastric area. There is no rigidity and no guarding.  Skin: Skin is warm and dry.       Assessment:     Emesis GERD    Plan:     Rx antiemetics and ppi Given Candesartan 16 mg daily to temporarily replace irbesartan

## 2018-08-11 ENCOUNTER — Ambulatory Visit: Payer: Self-pay | Admitting: Family Medicine

## 2018-09-08 ENCOUNTER — Ambulatory Visit: Payer: Self-pay | Admitting: Internal Medicine

## 2018-09-08 ENCOUNTER — Ambulatory Visit: Payer: Self-pay | Admitting: Cardiovascular Disease

## 2018-09-11 ENCOUNTER — Other Ambulatory Visit: Payer: Self-pay | Admitting: Ophthalmology

## 2018-09-11 DIAGNOSIS — J449 Chronic obstructive pulmonary disease, unspecified: Secondary | ICD-10-CM

## 2018-09-17 ENCOUNTER — Other Ambulatory Visit: Payer: Self-pay

## 2018-09-17 ENCOUNTER — Ambulatory Visit: Payer: Self-pay | Admitting: Pharmacist

## 2018-09-17 ENCOUNTER — Ambulatory Visit: Payer: Self-pay | Admitting: Pharmacy Technician

## 2018-09-17 ENCOUNTER — Ambulatory Visit: Payer: Self-pay

## 2018-09-17 ENCOUNTER — Encounter (INDEPENDENT_AMBULATORY_CARE_PROVIDER_SITE_OTHER): Payer: Self-pay

## 2018-09-17 ENCOUNTER — Encounter: Payer: Self-pay | Admitting: Pharmacist

## 2018-09-17 VITALS — BP 140/86 | Ht 61.0 in | Wt 178.0 lb

## 2018-09-17 DIAGNOSIS — Z79899 Other long term (current) drug therapy: Secondary | ICD-10-CM
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
## 2018-09-17 NOTE — Progress Notes (Addendum)
Completed [facility] application and contract.  Patient agreed to all terms of the [facility] contract.    Patient approved to receive medication assistance at [facility] as long as eligibility criteria continues to be met.    Provided patient with community resource material based on her particular needs.    Patient referred to [facility].   [place] [facility]

## 2018-09-27 ENCOUNTER — Ambulatory Visit: Payer: Disability Insurance | Attending: Ophthalmology

## 2018-09-27 DIAGNOSIS — J449 Chronic obstructive pulmonary disease, unspecified: Secondary | ICD-10-CM | POA: Diagnosis present

## 2018-09-27 MED ORDER — ALBUTEROL SULFATE (2.5 MG/3ML) 0.083% IN NEBU
2.5000 mg | INHALATION_SOLUTION | Freq: Once | RESPIRATORY_TRACT | Status: AC
  Administered 2018-09-27: 2.5 mg via RESPIRATORY_TRACT
  Filled 2018-09-27: qty 3

## 2018-10-11 ENCOUNTER — Encounter: Payer: Self-pay | Admitting: Internal Medicine

## 2018-12-17 ENCOUNTER — Other Ambulatory Visit: Payer: Self-pay

## 2018-12-17 ENCOUNTER — Encounter (INDEPENDENT_AMBULATORY_CARE_PROVIDER_SITE_OTHER): Payer: Self-pay

## 2018-12-17 ENCOUNTER — Ambulatory Visit: Payer: Disability Insurance | Admitting: Pharmacist

## 2018-12-17 DIAGNOSIS — Z79899 Other long term (current) drug therapy: Secondary | ICD-10-CM

## 2018-12-17 NOTE — Progress Notes (Addendum)
Called Ms. Barna this morning. She was scheduled for a MTM, however, we are only scheduling Outreach calls during Covid-19. Patent's name and DOB were verified. She has not traveled outside the [place] and she has not been sick. We filled amlodipine and metoprolol succinate for her at [facility] in January and these were transferred from [facility] on [address] Ms. Stoke had been seeing a provider at [facility]. The clinic is not currently seeing patients at this time. Ms. Brenchley was referred to [facility] in January. I told her today that I would contact [facility] and see if they are able to get her in as a new patient either via an Outreach call or Wednesday morning at the clinic.(Sent in basket message)  She is only taking the above two medications and needs all of her other medications restarted.  Outpatient Encounter Medications as of 12/17/2018  Medication Sig  . amLODipine (NORVASC) 10 MG tablet Take 1 tablet (10 mg total) by mouth daily.  . metoprolol (TOPROL-XL) 100 MG 24 hr tablet Take 1 tablet (100 mg total) by mouth daily. Take with or immediately following a meal. (Patient taking differently: Take 200 mg by mouth daily. Take with or immediately following a meal.)  . divalproex (DEPAKOTE ER) 500 MG 24 hr tablet Take 1 tablet (500 mg total) by mouth daily. (Patient not taking: Reported on 12/17/2018)  . FLUoxetine (PROZAC) 40 MG capsule Take 1 capsule (40 mg total) by mouth daily. (Patient not taking: Reported on 12/17/2018)  . hydrochlorothiazide (HYDRODIURIL) 25 MG tablet Take 25 mg by mouth daily.  . irbesartan (AVAPRO) 300 MG tablet Take 1 tablet (300 mg total) by mouth daily. (Patient not taking: Reported on 12/17/2018)  . omeprazole (PRILOSEC) 40 MG capsule Take 40 mg by mouth daily.  . [DISCONTINUED] Multiple Vitamin (MULTIVITAMIN WITH MINERALS) TABS tablet Take 1 tablet by mouth daily. (Patient not taking: Reported on 07/07/2018)   No facility-administered encounter  medications on file as of 12/17/2018.      I will follow up with the patient once I hear from [facility].  Richy Spradley K. Joelene Millin, PharmD [facility] Clinic-Pharmacy Operations Coordinator [phone]

## 2018-12-31 ENCOUNTER — Other Ambulatory Visit: Payer: Disability Insurance | Admitting: Pharmacist

## 2019-01-17 ENCOUNTER — Telehealth: Payer: Self-pay

## 2019-01-17 NOTE — Telephone Encounter (Signed)
Called patient LVM.  Trying to set patient up to become a new patient.

## 2019-09-27 ENCOUNTER — Encounter: Payer: Self-pay | Admitting: Emergency Medicine

## 2019-09-27 ENCOUNTER — Emergency Department
Admission: EM | Admit: 2019-09-27 | Discharge: 2019-09-27 | Disposition: A | Discharge status: Home / Self Care | Payer: Self-pay | Attending: Emergency Medicine | Admitting: Emergency Medicine

## 2019-09-27 ENCOUNTER — Emergency Department: Payer: Self-pay

## 2019-09-27 ENCOUNTER — Other Ambulatory Visit: Payer: Self-pay

## 2019-09-27 DIAGNOSIS — F1210 Cannabis abuse, uncomplicated: Secondary | ICD-10-CM | POA: Insufficient documentation

## 2019-09-27 DIAGNOSIS — F17210 Nicotine dependence, cigarettes, uncomplicated: Secondary | ICD-10-CM | POA: Insufficient documentation

## 2019-09-27 DIAGNOSIS — I10 Essential (primary) hypertension: Secondary | ICD-10-CM | POA: Insufficient documentation

## 2019-09-27 DIAGNOSIS — D709 Neutropenia, unspecified: Secondary | ICD-10-CM

## 2019-09-27 DIAGNOSIS — F1410 Cocaine abuse, uncomplicated: Secondary | ICD-10-CM | POA: Insufficient documentation

## 2019-09-27 DIAGNOSIS — Z20822 Contact with and (suspected) exposure to covid-19: Secondary | ICD-10-CM | POA: Insufficient documentation

## 2019-09-27 DIAGNOSIS — Z79899 Other long term (current) drug therapy: Secondary | ICD-10-CM | POA: Insufficient documentation

## 2019-09-27 DIAGNOSIS — K2920 Alcoholic gastritis without bleeding: Secondary | ICD-10-CM

## 2019-09-27 DIAGNOSIS — R1032 Left lower quadrant pain: Secondary | ICD-10-CM

## 2019-09-27 DIAGNOSIS — Z760 Encounter for issue of repeat prescription: Secondary | ICD-10-CM

## 2019-09-27 LAB — CBC WITH DIFFERENTIAL/PLATELET
Abs Immature Granulocytes: 0 10*3/uL (ref 0.00–0.07)
Basophils Absolute: 0 10*3/uL (ref 0.0–0.1)
Basophils Relative: 1 %
Eosinophils Absolute: 0.1 10*3/uL (ref 0.0–0.5)
Eosinophils Relative: 4 %
HCT: 39.7 % (ref 36.0–46.0)
Hemoglobin: 13.5 g/dL (ref 12.0–15.0)
Immature Granulocytes: 0 %
Lymphocytes Relative: 68 %
Lymphs Abs: 1.9 10*3/uL (ref 0.7–4.0)
MCH: 24.5 pg — ABNORMAL LOW (ref 26.0–34.0)
MCHC: 34 g/dL (ref 30.0–36.0)
MCV: 72.1 fL — ABNORMAL LOW (ref 80.0–100.0)
Monocytes Absolute: 0.4 10*3/uL (ref 0.1–1.0)
Monocytes Relative: 13 %
Neutro Abs: 0.4 10*3/uL — ABNORMAL LOW (ref 1.7–7.7)
Neutrophils Relative %: 14 %
Platelets: 311 10*3/uL (ref 150–400)
RBC: 5.51 MIL/uL — ABNORMAL HIGH (ref 3.87–5.11)
RDW: 15.1 % (ref 11.5–15.5)
Smear Review: NORMAL
WBC: 2.8 10*3/uL — ABNORMAL LOW (ref 4.0–10.5)
nRBC: 0 % (ref 0.0–0.2)

## 2019-09-27 LAB — CBC
HCT: 39.6 % (ref 36.0–46.0)
Hemoglobin: 13.6 g/dL (ref 12.0–15.0)
MCH: 24.6 pg — ABNORMAL LOW (ref 26.0–34.0)
MCHC: 34.3 g/dL (ref 30.0–36.0)
MCV: 71.6 fL — ABNORMAL LOW (ref 80.0–100.0)
Platelets: 300 10*3/uL (ref 150–400)
RBC: 5.53 MIL/uL — ABNORMAL HIGH (ref 3.87–5.11)
RDW: 15 % (ref 11.5–15.5)
WBC: 3 10*3/uL — ABNORMAL LOW (ref 4.0–10.5)
nRBC: 0 % (ref 0.0–0.2)

## 2019-09-27 LAB — ETHANOL: Alcohol, Ethyl (B): 110 mg/dL — ABNORMAL HIGH (ref <10)

## 2019-09-27 LAB — URINALYSIS, COMPLETE (UACMP) WITH MICROSCOPIC
Bacteria, UA: NONE SEEN
Bilirubin Urine: NEGATIVE
Glucose, UA: NEGATIVE mg/dL
Ketones, ur: NEGATIVE mg/dL
Leukocytes,Ua: NEGATIVE
Nitrite: NEGATIVE
Protein, ur: NEGATIVE mg/dL
Specific Gravity, Urine: 1.016 (ref 1.005–1.030)
pH: 5 (ref 5.0–8.0)

## 2019-09-27 LAB — COMPREHENSIVE METABOLIC PANEL
ALT: 55 U/L — ABNORMAL HIGH (ref 0–44)
AST: 96 U/L — ABNORMAL HIGH (ref 15–41)
Albumin: 3.8 g/dL (ref 3.5–5.0)
Alkaline Phosphatase: 102 U/L (ref 38–126)
Anion gap: 15 (ref 5–15)
BUN: 15 mg/dL (ref 6–20)
CO2: 18 mmol/L — ABNORMAL LOW (ref 22–32)
Calcium: 9 mg/dL (ref 8.9–10.3)
Chloride: 101 mmol/L (ref 98–111)
Creatinine, Ser: 0.59 mg/dL (ref 0.44–1.00)
GFR calc Af Amer: >60 mL/min (ref >60)
GFR calc non Af Amer: >60 mL/min (ref >60)
Glucose, Bld: 102 mg/dL — ABNORMAL HIGH (ref 70–99)
Potassium: 4.3 mmol/L (ref 3.5–5.1)
Sodium: 134 mmol/L — ABNORMAL LOW (ref 135–145)
Total Bilirubin: 1.2 mg/dL (ref 0.3–1.2)
Total Protein: 7.6 g/dL (ref 6.5–8.1)

## 2019-09-27 LAB — PATHOLOGIST SMEAR REVIEW

## 2019-09-27 LAB — LIPASE, BLOOD: Lipase: 22 U/L (ref 11–51)

## 2019-09-27 LAB — SARS CORONAVIRUS 2 (TAT 6-24 HRS): SARS Coronavirus 2: NEGATIVE

## 2019-09-27 MED ORDER — AMLODIPINE BESYLATE 10 MG PO TABS: 10.0000 mg | ORAL_TABLET | Freq: Every day | ORAL | 0 refills | Status: DC

## 2019-09-27 MED ORDER — METOPROLOL SUCCINATE ER 100 MG PO TB24: 100.0000 mg | ORAL_TABLET | Freq: Every day | ORAL | 0 refills | Status: DC

## 2019-09-27 MED ORDER — ALUMINUM-MAGNESIUM-SIMETHICONE 200-200-20 MG/5ML PO SUSP: 30.0000 mL | Freq: Three times a day (TID) | ORAL | 0 refills | Status: DC

## 2019-09-27 MED ORDER — ONDANSETRON HCL 4 MG/2ML IJ SOLN
4.0000 mg | Freq: Once | INTRAMUSCULAR | Status: AC
  Administered 2019-09-27: 4 mg via INTRAVENOUS
  Filled 2019-09-27: qty 2

## 2019-09-27 MED ORDER — DIVALPROEX SODIUM ER 500 MG PO TB24: 1000.0000 mg | ORAL_TABLET | Freq: Every day | ORAL | 0 refills | Status: AC

## 2019-09-27 MED ORDER — DIVALPROEX SODIUM ER 500 MG PO TB24: 500.0000 mg | ORAL_TABLET | Freq: Every day | ORAL | 0 refills | Status: DC

## 2019-09-27 MED ORDER — IOHEXOL 300 MG/ML  SOLN
100.0000 mL | Freq: Once | INTRAMUSCULAR | Status: AC | PRN
  Administered 2019-09-27: 12:00:00 100 mL via INTRAVENOUS

## 2019-09-27 MED ORDER — IRBESARTAN 300 MG PO TABS: 300.0000 mg | ORAL_TABLET | Freq: Every day | ORAL | 0 refills | Status: AC

## 2019-09-27 MED ORDER — SODIUM CHLORIDE 0.9 % IV BOLUS
1000.0000 mL | Freq: Once | INTRAVENOUS | Status: AC
  Administered 2019-09-27: 1000 mL via INTRAVENOUS

## 2019-09-27 MED ORDER — SODIUM CHLORIDE 0.9% FLUSH
3.0000 mL | Freq: Once | INTRAVENOUS | Status: AC
  Administered 2019-09-27: 3 mL via INTRAVENOUS

## 2019-09-27 MED ORDER — AMLODIPINE BESYLATE 10 MG PO TABS: 10.0000 mg | ORAL_TABLET | Freq: Every day | ORAL | 0 refills | Status: AC

## 2019-09-27 MED ORDER — PANTOPRAZOLE SODIUM 40 MG IV SOLR
40.0000 mg | Freq: Once | INTRAVENOUS | Status: AC
  Administered 2019-09-27: 40 mg via INTRAVENOUS
  Filled 2019-09-27: qty 40

## 2019-09-27 MED ORDER — OMEPRAZOLE 40 MG PO CPDR: 40.0000 mg | DELAYED_RELEASE_CAPSULE | Freq: Every day | ORAL | 1 refills | Status: AC

## 2019-09-27 MED ORDER — FLUOXETINE HCL 40 MG PO CAPS: 40.0000 mg | ORAL_CAPSULE | Freq: Every day | ORAL | 3 refills | Status: AC

## 2019-09-27 NOTE — ED Notes (Signed)
Pt provided po fluid and crackers for po challenge.
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
## 2019-09-27 NOTE — ED Notes (Signed)
Pt tolerated PO fluid and food well- denies n/v

## 2019-09-27 NOTE — ED Triage Notes (Signed)
Says she has been having stomach pains after eating at times.  Yesterday she ate some bologna yesterday and has had constant pain since--left side mid abdomen.  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

## 2019-09-27 NOTE — Discharge Instructions (Signed)
Avoid alcohol to prevent recurrent of your symptoms. Return to the ER right away if you have worsening pain, fever, or other concerns.

## 2019-10-28 ENCOUNTER — Telehealth: Payer: Self-pay | Admitting: Pharmacy Technician

## 2019-10-28 NOTE — Telephone Encounter (Signed)
Patient presented to [facility] upset about housing situation.  Stated that she will be homeless on Saturday, 11/02/19.  Contacted [facility] and [facility].  Housing Specialist from [facility] is to contact patient to do application.  Also, contacted Conservator, museum/gallery at [employer].  Provided patient with Mr. Bernette Redbird contact information.  Patient is to contact [place] prior to Saturday to discuss coming to the shelter.  Sherilyn Dacosta Care Manager [facility]

## 2019-11-11 ENCOUNTER — Other Ambulatory Visit: Payer: Disability Insurance

## 2019-11-14 ENCOUNTER — Telehealth: Payer: Self-pay | Admitting: Pharmacy Technician

## 2019-11-14 NOTE — Telephone Encounter (Signed)
Received updated proof of income.  Patient eligible to receive medication assistance at [facility] until time for re-certification in 7227, and as long as eligibility requirements continue to be met.  Patient stated that she is living with a friend.  Friend is kicking her out at the end of March 2021.  Needs assistance with locating housing.  Contacted Psychologist, forensic at [employer].  Patient is to speak with Joyce Gross.  Provided patient with phone number for rapid rehousing.  Also, put in a referral to [facility] to assist patient with locating housing.  [place] [facility]

## 2020-11-12 ENCOUNTER — Telehealth: Payer: Self-pay | Admitting: Pharmacist

## 2020-11-12 NOTE — Telephone Encounter (Signed)
Patient failed to provide requested 2022 financial documentation. Unable to determine patient's eligibility status. No additional medication assistance will be provided by [facility] without the required proof of income documentation. Patient notified by letter.  Vonda Henderson [facility] Administrative Assistant 

## 2021-01-22 ENCOUNTER — Other Ambulatory Visit: Payer: Self-pay

## 2021-07-15 ENCOUNTER — Other Ambulatory Visit: Payer: Self-pay

## 2021-11-02 ENCOUNTER — Ambulatory Visit: Payer: Self-pay | Admitting: Family Medicine

## 2021-11-02 ENCOUNTER — Other Ambulatory Visit: Payer: Self-pay

## 2021-11-02 DIAGNOSIS — A599 Trichomoniasis, unspecified: Secondary | ICD-10-CM

## 2021-11-02 DIAGNOSIS — A64 Unspecified sexually transmitted disease: Secondary | ICD-10-CM

## 2021-11-02 LAB — WET PREP FOR TRICH, YEAST, CLUE
Trichomonas Exam: POSITIVE — AB
Yeast Exam: NEGATIVE

## 2021-11-02 LAB — HM HIV SCREENING LAB: HM HIV Screening: NEGATIVE

## 2021-11-02 MED ORDER — METRONIDAZOLE 500 MG PO TABS: 500.0000 mg | ORAL_TABLET | Freq: Two times a day (BID) | ORAL | 0 refills | Status: AC

## 2021-11-02 NOTE — Progress Notes (Signed)
I was consulted about client's + wet prep for trich and agree with the treatment plan as per SO.  The client declined the interview and PE,  States would do her self-collect wet prep and NAAT

## 2021-11-02 NOTE — Progress Notes (Signed)
ROI for pap faxed with confirmation. Wet mount reviewed, patient treated for trich per SO. TC to [facility] Department and patient gave consent for results, per Ms. Mayford Knife at [facility] Dept. Ms. Mayford Knife confirms diagnosis of trich on recent pap test. Patient partner card given.Burt Knack, RN

## 2021-11-02 NOTE — Progress Notes (Signed)
Patient here for STD testing and would like bloodwork. States she had a Pap in [place], unsure date, but February 2023, and just moved back to [place]. She received a phone call that she had an STD found during Pap. She is unsure the name of the STD and states she asked how she got it and was told that she got it from having sex. Denies vaginal discharge and pelvic pain.Burt Knack, RN

## 2021-11-18 IMAGING — CT CT ABD-PELV W/ CM
2 of 5 series · 15 of 46 positions shown, 17 images · IV contrast (APPLIED)
Comparison: None.

CLINICAL DATA: Abdominal pain with nausea and vomiting.

EXAM:
CT ABDOMEN AND PELVIS WITH CONTRAST
TECHNIQUE: Multidetector CT imaging of the abdomen and pelvis was performed
using the standard protocol following bolus administration of
intravenous contrast.
CONTRAST:  100mL OMNIPAQUE IOHEXOL 300 MG/ML  SOLN

[Series 2: routine abd/pel with · axial · 0.79mm/px · z∈[-771,-396]mm · 12 of 85 slices shown, 14 images]
[im 5/85  soft-tissue]
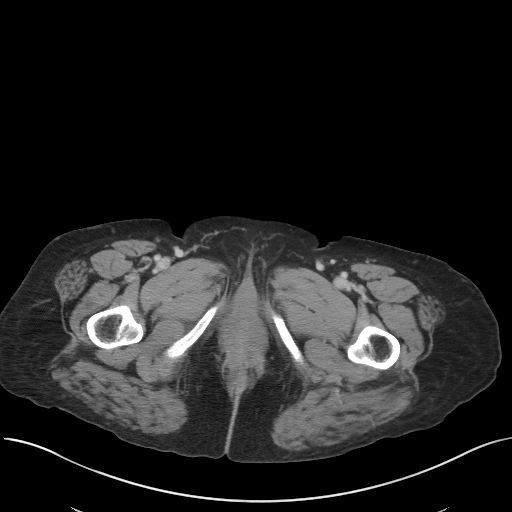
[im 5/85  bone]
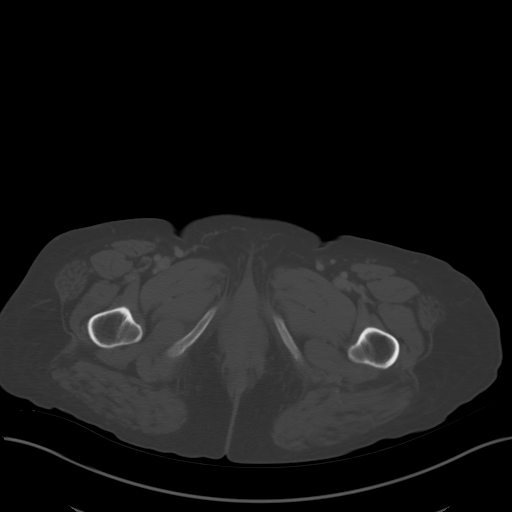
[im 14/85  soft-tissue]
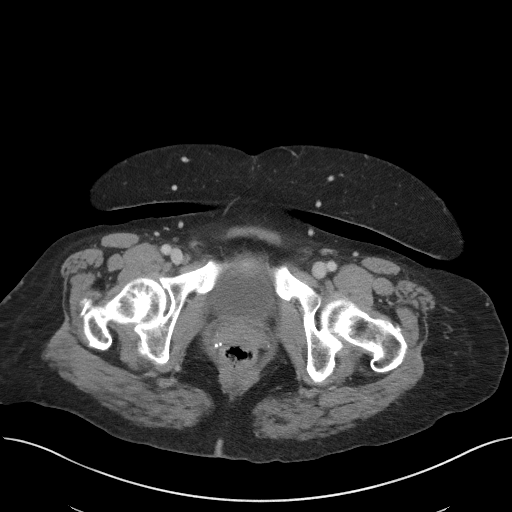
[im 18/85  soft-tissue]
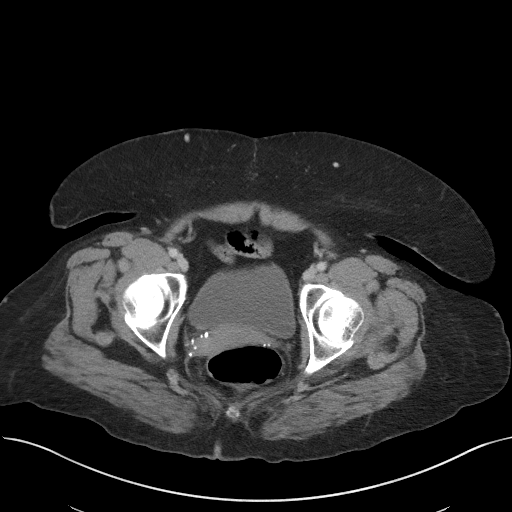
[im 27/85  soft-tissue]
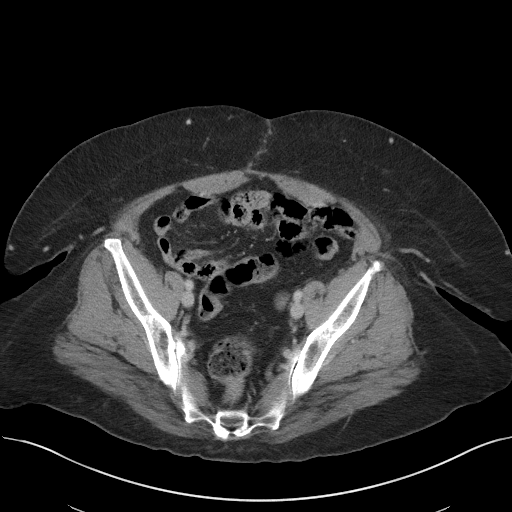
[im 31/85  soft-tissue]
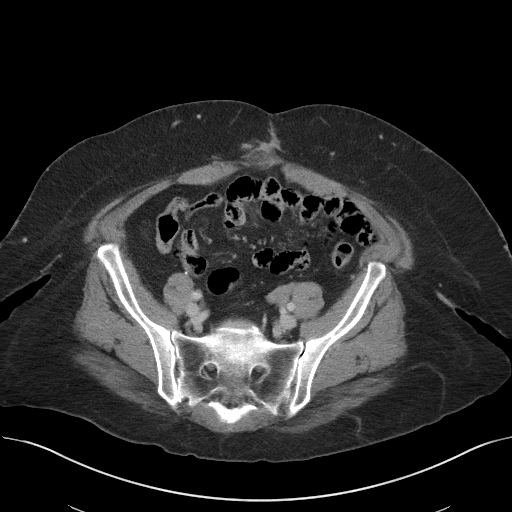
[im 40/85  soft-tissue]
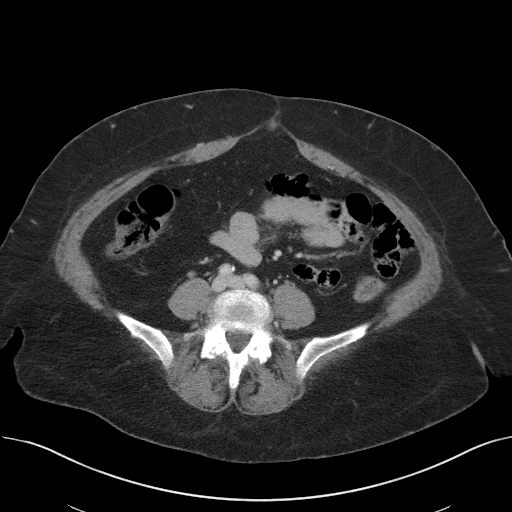
[im 45/85  soft-tissue]
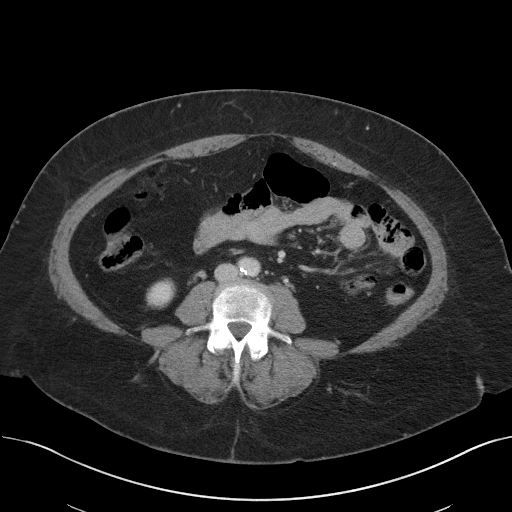
[im 54/85  soft-tissue]
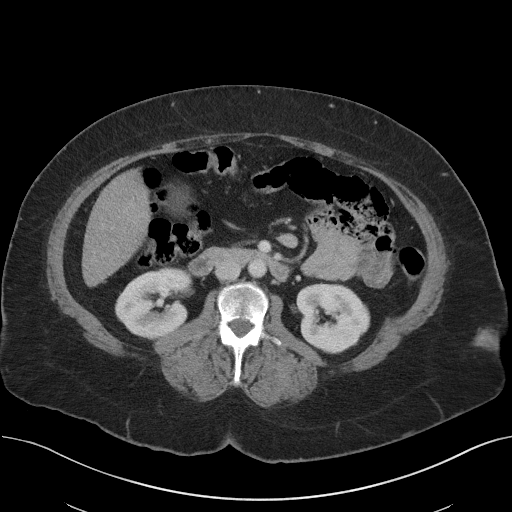
[im 58/85  soft-tissue]
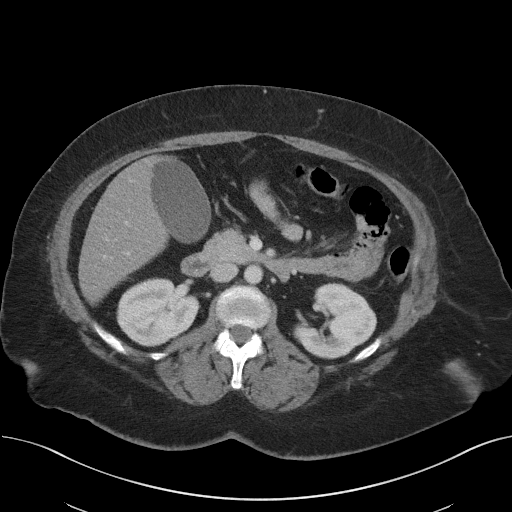
[im 58/85  bone]
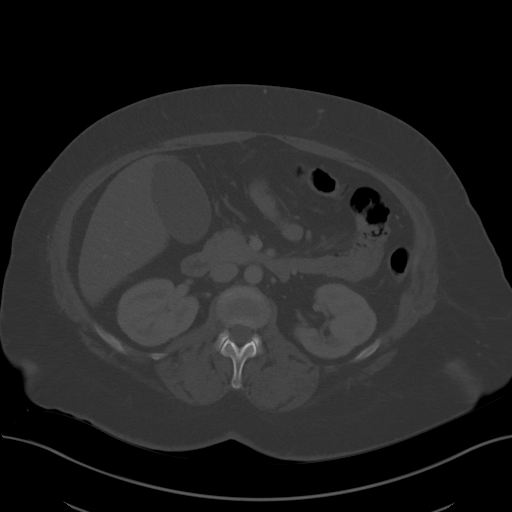
[im 67/85  soft-tissue]
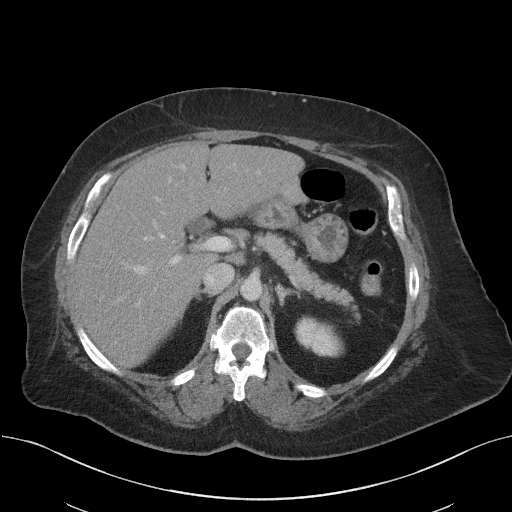
[im 71/85  soft-tissue]
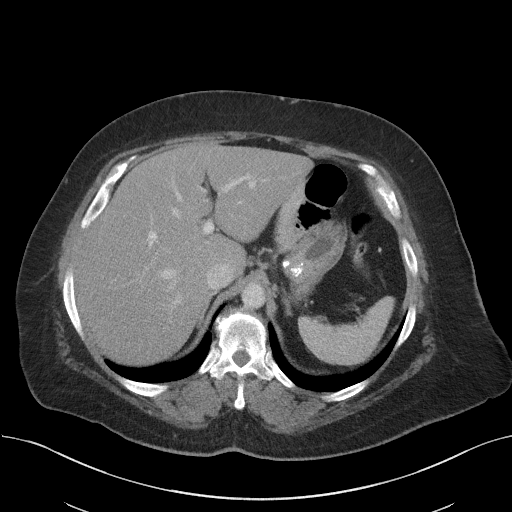
[im 80/85  soft-tissue]
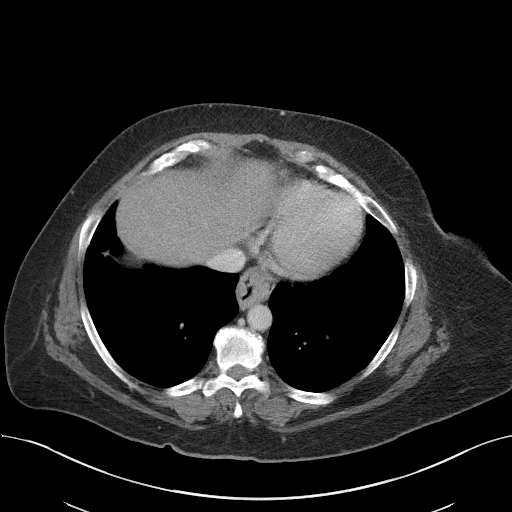

[Series 5: coronal st · coronal · 0.71mm/px · 3 of 101 slices shown]
[im 34/101  soft-tissue]
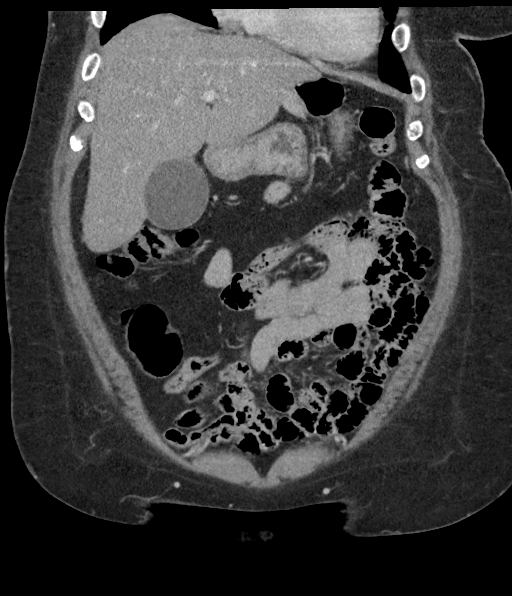
[im 45/101  soft-tissue]
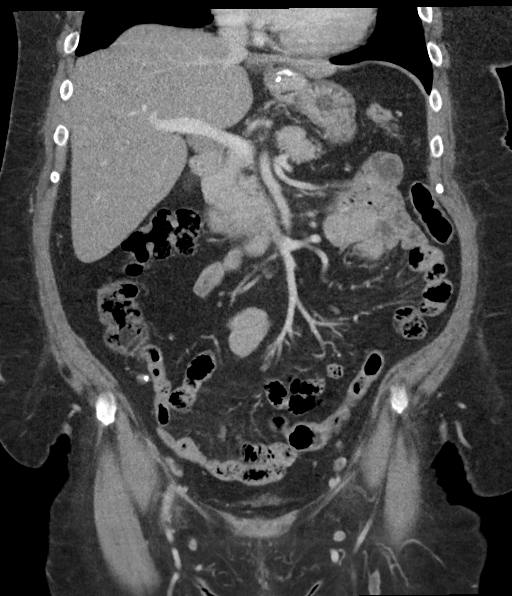
[im 56/101  soft-tissue]
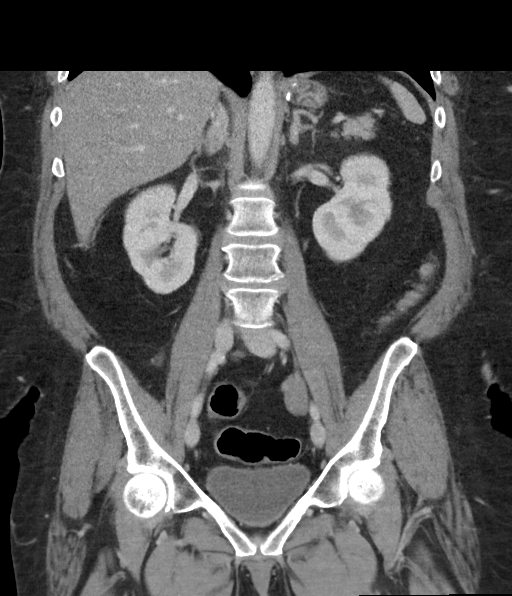

[15 of 46 positions shown; findings below may reference images not displayed]

FINDINGS: Lower chest: There is slight atelectatic change in the right base.
No lung base edema or consolidation. There is a focal hiatal hernia.

Hepatobiliary: There is hepatic steatosis. No focal liver lesions
are evident. Gallbladder wall is not appreciably thickened. There is
no biliary duct dilatation.

Pancreas: No pancreatic mass or inflammatory focus evident.

Spleen: No splenic lesions are appreciable.

Adrenals/Urinary Tract: Adrenals bilaterally appear normal. Kidneys
bilaterally show no evident mass or hydronephrosis on either side.
There is no evident renal or ureteral calculus on either side.
Urinary bladder is midline with wall thickness within normal limits.

Stomach/Bowel: There is postoperative change in the upper stomach
and jejunal regions consistent with previous gastric bypass type
procedure. No wall thickening or fluid in postoperative regions.
Elsewhere, there is no appreciable bowel wall or mesenteric
thickening. No appreciable diverticular disease evident. No evident
bowel obstruction. Terminal ileum appears normal. No free air or
portal venous air.

Vascular/Lymphatic: There is no abdominal aortic aneurysm. There are
foci of aortic and iliac artery atherosclerotic calcification. Major
venous structures appear patent. No evident adenopathy in the
abdomen or pelvis.

Reproductive: Uterus appears mildly atrophic. No pelvic mass
evident.

Other: Appendix absent. No periappendiceal region inflammation.
There is no evident adenopathy or abscess in the abdomen or pelvis.
There is a small umbilical hernia containing only fat. There is
scarring in the periumbilical region of the anterior abdominal wall.

Musculoskeletal: There are foci of degenerative change in the lumbar
spine. There are no blastic or lytic bone lesions. No intramuscular
lesions are evident.
IMPRESSION: 1. Status post gastric bypass procedure without complicating
features. No diverticular disease evident. No bowel obstruction. No
abscess in the abdomen or pelvis. Appendix absent. No
periappendiceal region inflammation.

2. No renal or ureteral calculus. No hydronephrosis. Urinary bladder
wall thickness normal.

3.  Hepatic steatosis.

4. Hiatal hernia present. Small umbilical hernia containing only fat
present. Periumbilical scarring noted.

5.  Aortic Atherosclerosis ([id number]).

## 2022-08-07 ENCOUNTER — Emergency Department: Payer: Disability Insurance

## 2022-08-07 ENCOUNTER — Other Ambulatory Visit: Payer: Self-pay

## 2022-08-07 ENCOUNTER — Encounter: Payer: Self-pay | Admitting: Emergency Medicine

## 2022-08-07 ENCOUNTER — Emergency Department
Admission: EM | Admit: 2022-08-07 | Discharge: 2022-08-07 | Disposition: A | Discharge status: Home / Self Care | Payer: Disability Insurance | Attending: Emergency Medicine | Admitting: Emergency Medicine

## 2022-08-07 DIAGNOSIS — F17200 Nicotine dependence, unspecified, uncomplicated: Secondary | ICD-10-CM | POA: Insufficient documentation

## 2022-08-07 DIAGNOSIS — R052 Subacute cough: Secondary | ICD-10-CM | POA: Insufficient documentation

## 2022-08-07 DIAGNOSIS — Z20822 Contact with and (suspected) exposure to covid-19: Secondary | ICD-10-CM | POA: Insufficient documentation

## 2022-08-07 LAB — RESP PANEL BY RT-PCR (FLU A&B, COVID) ARPGX2
Influenza A by PCR: NEGATIVE
Influenza B by PCR: NEGATIVE
SARS Coronavirus 2 by RT PCR: NEGATIVE

## 2022-08-07 MED ORDER — GUAIFENESIN-DM 100-10 MG/5ML PO SYRP: 5.0000 mL | ORAL_SOLUTION | ORAL | 0 refills | Status: DC | PRN

## 2022-08-07 NOTE — ED Notes (Signed)
Pt verbalizes understanding of d/c  instructons

## 2022-08-07 NOTE — Discharge Instructions (Signed)
   Thank you for choosing us for your health care today!  Please see your primary doctor this week for a follow up appointment.   If you do not have a primary doctor call the following clinics to establish care:  If you have insurance:  [facility] [phone] [address] [place] [address]   [facility]  [phone] [address] [place] [address]   If you do not have insurance:  [facility]  [phone] [address] [place] [address]  Sometimes, in the early stages of certain disease courses it is difficult to detect in the emergency department evaluation -- so, it is important that you continue to monitor your symptoms and call your doctor right away or return to the emergency department if you develop any new or worsening symptoms.  It was my pleasure to care for you today.   Kalayna Noy S. Vernice Bowker, MD  

## 2022-08-07 NOTE — ED Provider Notes (Signed)
[facility] Provider Note    Event Date/Time   First MD Initiated Contact with Patient 08/07/22 1817     (approximate)   History   Cough   HPI  Che Below is a 56 y.o. female   Past medical history of daily smoker, depression, alcohol use, migraines, bipolar who presents to the emergency department with lingering cough after viral URI symptoms of cough, congestion, myalgias 3 weeks ago.  Her symptoms have mostly resolved but have a lingering cough nonproductive.  No chest pain or shortness of breath.  No fever or chills.  No other medical complaints.  She states that gradually her voice has been becoming hoarse.  History was obtained via the patient.      Physical Exam   Triage Vital Signs: ED Triage Vitals [08/07/22 1437]  Enc Vitals Group     BP (!) 151/98     Pulse Rate (!) 117     Resp 18     Temp 97.9 F (36.6 C)     Temp Source Oral     SpO2 95 %     Weight      Height      Head Circumference      Peak Flow      Pain Score 0     Pain Loc      Pain Edu?      Excl. in GC?     Most recent vital signs: Vitals:   08/07/22 1437  BP: (!) 151/98  Pulse: (!) 117  Resp: 18  Temp: 97.9 F (36.6 C)  SpO2: 95%    General: Awake, no distress.  CV:  Good peripheral perfusion.  Resp:  Normal effort.  Abd:  No distention.  Other:  Patient is awake alert oriented and pleasant.  She was initially documented with tachycardia at triage at 117 but when I auscultated her she is normal rate and rhythm.  She is no longer tachycardic.  She is afebrile.  Her lungs are clear to auscultation bilaterally and oropharynx appears normal neck is supple with full range of motion.  She does have a hoarse voice.   ED Results / Procedures / Treatments   Labs (all labs ordered are listed, but only abnormal results are displayed) Labs Reviewed  RESP PANEL BY RT-PCR (FLU A&B, COVID) [id number]     I reviewed labs and they are notable for a neg  respiratory viral panel for flu and COVID.  RADIOLOGY I independently reviewed and interpreted chest x-ray and see no obvious focalities or pneumothorax.   PROCEDURES:  Critical Care performed: No  Procedures   MEDICATIONS ORDERED IN ED: Medications - No data to display  IMPRESSION / MDM / ASSESSMENT AND PLAN / ED COURSE  I reviewed the triage vital signs and the nursing notes.                              Differential diagnosis includes, but is not limited to, bronchitis, viral URI, bacterial pneumonia, deep space neck infection,    MDM: Patient with subacute bronchitis after viral URI lingering cough and hoarse voice.  She has no other medical complaints and appears very well.  I doubt emergent infectious pathologies like sepsis or deep space neck infection or abscess.  I advised her to reduce her smoking and prescribed antitussive and have her follow-up with PMD and return to emergency department if worsening. Patient's presentation is  most consistent with acute presentation with potential threat to life or bodily function.       FINAL CLINICAL IMPRESSION(S) / ED DIAGNOSES   Final diagnoses:  Subacute cough     Rx / DC Orders   ED Discharge Orders          Ordered    guaiFENesin-dextromethorphan (ROBITUSSIN DM) 100-10 MG/5ML syrup  Every 4 hours PRN        08/07/22 1838             Note:  This document was prepared using Dragon voice recognition software and may include unintentional dictation errors.    Pilar Jarvis, MD 08/07/22 [phone]

## 2022-08-07 NOTE — ED Triage Notes (Signed)
Patient to ED due to no voice x3 weeks. Patient states she also has cough. Denies sore throat or new SOB. NAD noted.

## 2022-08-07 NOTE — ED Provider Triage Note (Signed)
Emergency Medicine Provider Triage Evaluation Note  Tara Lamb , a 56 y.o. female  was evaluated in triage.  Pt complains of cough and hoarse voice for the past 3 weeks. No fever.  Physical Exam  BP (!) 151/98 (BP Location: Left Arm)   Pulse (!) 117   Temp 97.9 F (36.6 C) (Oral)   Resp 18   SpO2 95%  Gen:   Awake, no distress   Resp:  Normal effort  MSK:   Moves extremities without difficulty  Other:    Medical Decision Making  Medically screening exam initiated at 2:40 PM.  Appropriate orders placed.  Tara Lamb was informed that the remainder of the evaluation will be completed by another provider, this initial triage assessment does not replace that evaluation, and the importance of remaining in the ED until their evaluation is complete.    Chinita Pester, FNP 08/07/22 1441

## 2022-12-28 ENCOUNTER — Other Ambulatory Visit: Payer: Self-pay | Admitting: Nurse Practitioner

## 2022-12-28 DIAGNOSIS — Z1231 Encounter for screening mammogram for malignant neoplasm of breast: Secondary | ICD-10-CM

## 2023-01-04 ENCOUNTER — Emergency Department: Payer: Medicaid Other

## 2023-01-04 ENCOUNTER — Encounter: Payer: Self-pay | Admitting: Emergency Medicine

## 2023-01-04 ENCOUNTER — Emergency Department
Admission: EM | Admit: 2023-01-04 | Discharge: 2023-01-04 | Disposition: A | Discharge status: Home / Self Care | Payer: Medicaid Other | Attending: Emergency Medicine | Admitting: Emergency Medicine

## 2023-01-04 ENCOUNTER — Other Ambulatory Visit: Payer: Self-pay

## 2023-01-04 DIAGNOSIS — W01198A Fall on same level from slipping, tripping and stumbling with subsequent striking against other object, initial encounter: Secondary | ICD-10-CM | POA: Insufficient documentation

## 2023-01-04 DIAGNOSIS — Y92009 Unspecified place in unspecified non-institutional (private) residence as the place of occurrence of the external cause: Secondary | ICD-10-CM | POA: Diagnosis not present

## 2023-01-04 DIAGNOSIS — S42212A Unspecified displaced fracture of surgical neck of left humerus, initial encounter for closed fracture: Secondary | ICD-10-CM

## 2023-01-04 DIAGNOSIS — S4992XA Unspecified injury of left shoulder and upper arm, initial encounter: Secondary | ICD-10-CM | POA: Diagnosis present

## 2023-01-04 DIAGNOSIS — S0003XA Contusion of scalp, initial encounter: Secondary | ICD-10-CM | POA: Diagnosis not present

## 2023-01-04 DIAGNOSIS — S42292A Other displaced fracture of upper end of left humerus, initial encounter for closed fracture: Secondary | ICD-10-CM | POA: Diagnosis not present

## 2023-01-04 DIAGNOSIS — I10 Essential (primary) hypertension: Secondary | ICD-10-CM | POA: Insufficient documentation

## 2023-01-04 DIAGNOSIS — W19XXXA Unspecified fall, initial encounter: Secondary | ICD-10-CM

## 2023-01-04 MED ORDER — HYDROCODONE-ACETAMINOPHEN 5-325 MG PO TABS: 1.0000 | ORAL_TABLET | Freq: Four times a day (QID) | ORAL | 0 refills | Status: AC | PRN

## 2023-01-04 MED ORDER — HYDROCODONE-ACETAMINOPHEN 5-325 MG PO TABS
1.0000 | ORAL_TABLET | Freq: Once | ORAL | Status: AC
  Administered 2023-01-04: 1 via ORAL
  Filled 2023-01-04: qty 1

## 2023-01-04 NOTE — ED Triage Notes (Addendum)
Pt via POV from home. Pt had a mechanical fall yesterday after missing a step. Pt c/o L upper arm pain, L shoulder pain that radiates to the L side of her neck. States she did hit her head but denies blood thinners. No obvious deformity noted to the L shoulder. Pt is A&OX4 and NAD

## 2023-01-04 NOTE — ED Provider Notes (Signed)
[facility] Provider Note    Event Date/Time   First MD Initiated Contact with Patient 01/04/23 [phone]     (approximate)   History   Fall   HPI  Tara Lamb is a 57 y.o. female   presents to the ED with complaint of left shoulder pain after a fall that occurred when she missed a step yesterday morning hitting the concrete.  Patient states she also hit her head but no loss of consciousness and denies use of blood thinners.  Since yesterday she is continue to have pain in her left shoulder and decreased range of motion.  Has a history of hypertension, bipolar disorder, polysubstance abuse.      Physical Exam   Triage Vital Signs: ED Triage Vitals  Enc Vitals Group     BP 01/04/23 0912 (!) 160/96     Pulse Rate 01/04/23 0912 64     Resp 01/04/23 0912 18     Temp 01/04/23 0912 98.2 F (36.8 C)     Temp src --      SpO2 01/04/23 0912 100 %     Weight 01/04/23 0911 151 lb (68.5 kg)     Height 01/04/23 0911 5' (1.524 m)     Head Circumference --      Peak Flow --      Pain Score 01/04/23 0911 10     Pain Loc --      Pain Edu? --      Excl. in GC? --     Most recent vital signs: Vitals:   01/04/23 0912  BP: (!) 160/96  Pulse: 64  Resp: 18  Temp: 98.2 F (36.8 C)  SpO2: 100%     General: Awake, no distress.  Alert, talkative, answers questions appropriately.  Tearful. CV:  Good peripheral perfusion.  Heart regular rate and rhythm. Resp:  Normal effort.  Lungs are clear bilaterally. Abd:  No distention.  Other:  No obvious trauma to the scalp.  Minimal tenderness on palpation of the cervical spine posteriorly.  No abrasions, soft tissue edema or erythema present.  PERRLA, EOMI's, cranial nerves II through XII grossly intact.  Patient with noted ecchymosis to the medial aspect of her proximal left humerus.  Range of motion is restricted secondary to increased pain.  Patient is able to move digits, wrist and elbow without difficulty.  Motor or  sensory function intact.  Radial pulse present.  Skin is intact.  No tenderness on palpation of the thoracic or lumbar spine.  Patient is able move lower extremities without any difficulty and there is no tenderness on compression of the pelvis.   ED Results / Procedures / Treatments   Labs (all labs ordered are listed, but only abnormal results are displayed) Labs Reviewed - No data to display   RADIOLOGY Left shoulder x-ray images were reviewed and interpreted by myself independent of the radiologist and noted for a comminuted fracture of the humeral head and also small minimally displaced humeral surgical neck.  CT head and cervical spine per radiology was reviewed and is negative for acute intracranial findings or cervical spine fracture.   PROCEDURES:  Critical Care performed:   Procedures   MEDICATIONS ORDERED IN ED: Medications  HYDROcodone-acetaminophen (NORCO/VICODIN) 5-325 MG per tablet 1 tablet (1 tablet Oral Given 01/04/23 1021)     IMPRESSION / MDM / ASSESSMENT AND PLAN / ED COURSE  I reviewed the triage vital signs and the nursing notes.   Differential diagnosis  includes, but is not limited to, fracture left shoulder, contusion, sprain, head injury, head contusion, cervical fracture, cervical sprain, contusion secondary to mechanical fall.  57 year old female presents to the ED after mechanical fall that happened at home yesterday morning.  Patient has continued to have pain in her left shoulder.  She also reports that she did hit her head but denies loss of consciousness.  X-rays confirmed that patient does have a comminuted mildly displaced fracture of the humeral head and surgical neck.  Patient was placed in a sling and shoulder immobilizer with instruction to call and make an appointment with Dr. Audelia Acton who is the orthopedist on-call at [facility] today.  She was reassured that her CT head and cervical spine were without injury.  Patient was given hydrocodone  while in the ED.  A prescription for the same was sent to her pharmacy.  She is encouraged to ice and elevate when possible to reduce swelling and help with pain.      Patient's presentation is most consistent with acute presentation with potential threat to life or bodily function.  FINAL CLINICAL IMPRESSION(S) / ED DIAGNOSES   Final diagnoses:  Closed displaced fracture of surgical neck of left humerus, unspecified fracture morphology, initial encounter  Fracture of humeral head, closed, left, initial encounter  Contusion of scalp, initial encounter  Fall in home, initial encounter     Rx / DC Orders   ED Discharge Orders          Ordered    HYDROcodone-acetaminophen (NORCO/VICODIN) 5-325 MG tablet  Every 6 hours PRN        01/04/23 1029             Note:  This document was prepared using Dragon voice recognition software and may include unintentional dictation errors.   Tommi Rumps, PA-C 01/04/23 1054    Jene Every, MD 01/04/23 1121

## 2023-01-04 NOTE — Discharge Instructions (Addendum)
Call make an appointment with Dr. Audelia Acton, who is  the orthopedist listed on your discharge papers.  His phone number and address are listed on your discharge papers.  Wear the sling for support and immobilization.  You can take this off to take shower but will need to replace it after getting dressed.  Ice and elevation to reduce swelling which will also help with pain.  A prescription for pain medication was sent to the pharmacy for you to take as needed.  Be aware that this medication could cause drowsiness and increase your risk for falling.

## 2023-01-10 ENCOUNTER — Encounter: Payer: Self-pay | Admitting: *Deleted

## 2023-04-03 ENCOUNTER — Telehealth: Payer: Self-pay

## 2023-04-03 ENCOUNTER — Telehealth: Payer: Self-pay | Admitting: *Deleted

## 2023-04-03 ENCOUNTER — Other Ambulatory Visit: Payer: Self-pay | Admitting: *Deleted

## 2023-04-03 DIAGNOSIS — Z1211 Encounter for screening for malignant neoplasm of colon: Secondary | ICD-10-CM

## 2023-04-03 MED ORDER — PEG 3350-KCL-NABCB-NACL-NASULF 236 G PO SOLR: 4000.0000 mL | Freq: Once | ORAL | 0 refills | Status: AC

## 2023-04-03 NOTE — Telephone Encounter (Signed)
Colonoscopy schedule for 05/10/2023 with Dr Allegra Lai

## 2023-04-03 NOTE — Telephone Encounter (Signed)
Called patient on [phone] as request on other encounter.  Patient stated that her phone is [phone] which is disconnected right now until she pays her bill on the 04/06/2023.  Inform patient that it was not the number on file for patient and that I have updated it in her records.

## 2023-04-03 NOTE — Telephone Encounter (Signed)
Gastroenterology Pre-Procedure Review  Request Date: 05/10/2023 Requesting Physician: Dr. Allegra Lai   PATIENT REVIEW QUESTIONS: The patient responded to the following health history questions as indicated:    1. Are you having any GI issues? no 2. Do you have a personal history of Polyps? no 3. Do you have a family history of Colon Cancer or Polyps? no 4. Diabetes Mellitus? no 5. Joint replacements in the past 12 months?no 6. Major health problems in the past 3 months?no 7. Any artificial heart valves, MVP, or defibrillator?no    MEDICATIONS & ALLERGIES:    Patient reports the following regarding taking any anticoagulation/antiplatelet therapy:   Plavix, Coumadin, Eliquis, Xarelto, Lovenox, Pradaxa, Brilinta, or Effient? no Aspirin? no  Patient confirms/reports the following medications:  Current Outpatient Medications  Medication Sig Dispense Refill   amLODipine (NORVASC) 10 MG tablet Take 1 tablet (10 mg total) by mouth daily. 30 tablet 0   divalproex (DEPAKOTE ER) 500 MG 24 hr tablet Take 2 tablets (1,000 mg total) by mouth daily. 60 tablet 0   FLUoxetine (PROZAC) 40 MG capsule Take 1 capsule (40 mg total) by mouth daily. 30 capsule 3   hydrochlorothiazide (HYDRODIURIL) 25 MG tablet Take 25 mg by mouth daily.     HYDROcodone-acetaminophen (NORCO/VICODIN) 5-325 MG tablet Take 1 tablet by mouth every 6 (six) hours as needed. 15 tablet 0   irbesartan (AVAPRO) 300 MG tablet Take 1 tablet (300 mg total) by mouth daily. 30 tablet 0   metoprolol succinate (TOPROL-XL) 100 MG 24 hr tablet Take 1 tablet (100 mg total) by mouth daily. Take with or immediately following a meal. 30 tablet 0   omeprazole (PRILOSEC) 40 MG capsule Take 1 capsule (40 mg total) by mouth daily. 30 capsule 1   No current facility-administered medications for this visit.    Patient confirms/reports the following allergies:  Allergies  Allergen Reactions   Banana Hives    No orders of the defined types were placed in  this encounter.   AUTHORIZATION INFORMATION Primary Insurance: 1D#: Group #:  Secondary Insurance: 1D#: Group #:  SCHEDULE INFORMATION: Date: 05/10/2023 Time: Location:  [facility]

## 2023-04-03 NOTE — Telephone Encounter (Signed)
Patient is calling to schedule colonoscopy. Please call patient back on the number provider because her phone is disconnected

## 2023-04-04 ENCOUNTER — Other Ambulatory Visit: Payer: Self-pay | Admitting: *Deleted

## 2023-04-04 ENCOUNTER — Inpatient Hospital Stay
Admission: RE | Admit: 2023-04-04 | Discharge: 2023-04-04 | Disposition: A | Discharge status: Home / Self Care | Payer: Self-pay | Source: Ambulatory Visit | Attending: Nurse Practitioner | Admitting: Nurse Practitioner

## 2023-04-04 DIAGNOSIS — Z1231 Encounter for screening mammogram for malignant neoplasm of breast: Secondary | ICD-10-CM

## 2023-04-11 ENCOUNTER — Ambulatory Visit
Admission: RE | Admit: 2023-04-11 | Discharge: 2023-04-11 | Disposition: A | Discharge status: Home / Self Care | Payer: MEDICAID | Source: Ambulatory Visit | Attending: Nurse Practitioner | Admitting: Nurse Practitioner

## 2023-04-11 DIAGNOSIS — Z1231 Encounter for screening mammogram for malignant neoplasm of breast: Secondary | ICD-10-CM | POA: Diagnosis not present

## 2023-05-10 ENCOUNTER — Ambulatory Visit: Admission: RE | Admit: 2023-05-10 | Payer: MEDICAID | Source: Home / Self Care | Admitting: Gastroenterology

## 2023-05-10 ENCOUNTER — Encounter: Payer: Self-pay | Admitting: Registered Nurse

## 2023-05-10 ENCOUNTER — Telehealth: Payer: Self-pay

## 2023-05-10 ENCOUNTER — Encounter: Admission: RE | Payer: Self-pay | Source: Home / Self Care

## 2023-05-10 SURGERY — COLONOSCOPY WITH PROPOFOL
Anesthesia: General

## 2023-05-10 NOTE — Telephone Encounter (Signed)
Patient no showed colonoscopy today with Dr. Allegra Lai on 05/10/2023. Rosann Auerbach states she called patient and left a message for call back and unable to reach patient to tell patient the time of her procedure. Tried to call the patient number in chart and it said it can not be completed as dialed .   Sending to you ginger so you can send a  no show fee to patient

## 2023-05-10 NOTE — Telephone Encounter (Signed)
I went to the 04/03/2023 telephone encounter and called the number [phone] and got nothing.  I have tried [phone] on file just to check again and is disconnected. This is patient's main number and it was disconnected when we were schedule but she stated that was going to pay the bill in August as per my note.

## 2023-12-27 ENCOUNTER — Ambulatory Visit: Attending: Cardiology | Admitting: Cardiology

## 2024-02-05 ENCOUNTER — Telehealth: Payer: Self-pay | Admitting: *Deleted

## 2024-02-05 NOTE — Telephone Encounter (Signed)
 Lmovm to verify card hx.

## 2024-02-06 ENCOUNTER — Encounter: Payer: Self-pay | Admitting: Cardiology

## 2024-02-06 ENCOUNTER — Ambulatory Visit: Attending: Cardiology | Admitting: Cardiology

## 2024-02-06 VITALS — BP 147/77 | HR 83 | Ht 60.0 in | Wt 145.2 lb

## 2024-02-06 DIAGNOSIS — F17200 Nicotine dependence, unspecified, uncomplicated: Secondary | ICD-10-CM

## 2024-02-06 DIAGNOSIS — I10 Essential (primary) hypertension: Secondary | ICD-10-CM | POA: Diagnosis not present

## 2024-02-06 DIAGNOSIS — R072 Precordial pain: Secondary | ICD-10-CM

## 2024-02-06 DIAGNOSIS — E7800 Pure hypercholesterolemia, unspecified: Secondary | ICD-10-CM

## 2024-02-06 MED ORDER — ASPIRIN 81 MG PO TBEC: 81.0000 mg | DELAYED_RELEASE_TABLET | Freq: Every day | ORAL | 3 refills | Status: AC

## 2024-02-06 MED ORDER — METOPROLOL TARTRATE 100 MG PO TABS: ORAL_TABLET | ORAL | 0 refills | Status: DC
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
## 2024-02-06 NOTE — Patient Instructions (Signed)
 Medication Instructions:  Your physician recommends the following medication changes.  START TAKING: Aspirin  81 mg daily   *If you need a refill on your cardiac medications before your next appointment, please call your pharmacy*  Lab Work: Your provider would like for you to have following labs drawn today BMP.  Your provider would like for you to get a lipid panel drawn. You can complete this on the day of the echocardiogram.   Please go to [facility] [address] (Medical Arts Building) [address], [place] [address] You do not need an appointment.  They are open from 8 am- 4:30 pm.  Lunch from 1:00 pm- 2:00 pm You do need to be fasting.  If you have labs (blood work) drawn today and your tests are completely normal, you will receive your results only by: MyChart Message (if you have MyChart) OR A paper copy in the mail If you have any lab test that is abnormal or we need to change your treatment, we will call you to review the results.  Testing/Procedures: Your physician has requested that you have an echocardiogram. Echocardiography is a painless test that uses sound waves to create images of your heart. It provides your doctor with information about the size and shape of your heart and how well your heart's chambers and valves are working.   You may receive an ultrasound enhancing agent through an IV if needed to better visualize your heart during the echo. This procedure takes approximately one hour.  There are no restrictions for this procedure.  This will take place at [address] [facility] Arts [facility]) [address], [place] [address]  Please note: We ask at that you not bring children with you during ultrasound (echo/ vascular) testing. Due to room size and safety concerns, children are not allowed in the ultrasound rooms during exams. Our front office staff cannot provide observation of children in our lobby area while testing is being conducted. An adult  accompanying a patient to their appointment will only be allowed in the ultrasound room at the discretion of the ultrasound technician under special circumstances. We apologize for any inconvenience.     Your cardiac CT will be scheduled at:  [facility] [address] [place], [place] [address] [phone]  Please arrive 15 mins early for check-in and test prep.  There is spacious parking and easy access to the radiology department from the [facility] V[facility] entrance. Please enter here and check-in with the desk attendant.    Please follow these instructions carefully (unless otherwise directed):  An IV will be required for this test and Nitroglycerin  will be given.  Hold all erectile dysfunction medications at least 3 days (72 hrs) prior to test. (Ie viagra, cialis, sildenafil, tadalafil, etc)   On the Night Before the Test: Be sure to Drink plenty of water. Do not consume any caffeinated/decaffeinated beverages or chocolate 12 hours prior to your test. Do not take any antihistamines 12 hours prior to your test.  On the Day of the Test: Drink plenty of water until 1 hour prior to the test. Do not eat any food 1 hour prior to test. You may take your regular medications prior to the test.  Take metoprolol  (Lopressor ) two hours prior to test. If you take Furosemide/Hydrochlorothiazide /Spironolactone/Chlorthalidone, please HOLD on the morning of the test. FEMALES- please wear underwire-free bra if available, avoid dresses & tight clothing       After the Test: Drink plenty of water.  After receiving IV contrast, you may experience a mild flushed feeling. This is normal. On occasion, you may experience a mild rash up to 24 hours after the test. This is not dangerous. If this occurs, you can take Benadryl 25 mg, Zyrtec, Claritin, or Allegra and increase your fluid intake. (Patients taking Tikosyn should avoid Benadryl, and may take Zyrtec, Claritin, or  Allegra) If you experience trouble breathing, this can be serious. If it is severe call 911 IMMEDIATELY. If it is mild, please call our office.  We will call to schedule your test 2-4 weeks out understanding that some insurance companies will need an authorization prior to the service being performed.   For more information and frequently asked questions, please visit our website : [email or web address]  For non-scheduling related questions, please contact the cardiac imaging nurse navigator should you have any questions/concerns: Cardiac Imaging Nurse Navigators Direct Office Dial: [phone]   For scheduling needs, including cancellations and rescheduling, please call [place], [phone].    Follow-Up: At [facility], you and your health needs are our priority.  As part of our continuing mission to provide you with exceptional heart care, our providers are all part of one team.  This team includes your primary Cardiologist (physician) and Advanced Practice Providers or APPs (Physician Assistants and Nurse Practitioners) who all work together to provide you with the care you need, when you need it.  Your next appointment:   3 month(s)  Provider:   You may see Dr. Junnie Olives or one of the following Advanced Practice Providers on your designated Care Team:   Laneta Pintos, NP Gildardo Labrador, PA-C Varney Gentleman, PA-C Cadence Cheraw, PA-C Ronald Cockayne, NP Morey Ar, NP    We recommend signing up for the patient portal called "MyChart".  Sign up information is provided on this After Visit Summary.  MyChart is used to connect with patients for Virtual Visits (Telemedicine).  Patients are able to view lab/test results, encounter notes, upcoming appointments, etc.  Non-urgent messages can be sent to your provider as well.   To learn more about what you can do with MyChart, go to [email or web address].

## 2024-02-07 LAB — BASIC METABOLIC PANEL WITH GFR
BUN/Creatinine Ratio: 19 (ref 9–23)
BUN: 13 mg/dL (ref 6–24)
CO2: 15 mmol/L — ABNORMAL LOW (ref 20–29)
Calcium: 9.6 mg/dL (ref 8.7–10.2)
Chloride: 84 mmol/L — ABNORMAL LOW (ref 96–106)
Creatinine, Ser: 0.68 mg/dL (ref 0.57–1.00)
Glucose: 82 mg/dL (ref 70–99)
Potassium: 4.8 mmol/L (ref 3.5–5.2)
Sodium: 122 mmol/L — ABNORMAL LOW (ref 134–144)
eGFR: 101 mL/min/{1.73_m2} (ref >59)

## 2024-02-23 ENCOUNTER — Ambulatory Visit: Attending: Cardiology

## 2024-02-23 DIAGNOSIS — R072 Precordial pain: Secondary | ICD-10-CM | POA: Diagnosis not present

## 2024-02-24 LAB — ECHOCARDIOGRAM COMPLETE
AR max vel: 2.95 cm2
AV Area VTI: 3.06 cm2
AV Area mean vel: 2.81 cm2
AV Mean grad: 4.5 mmHg
AV Peak grad: 8.1 mmHg
Ao pk vel: 1.42 m/s
Area-P 1/2: 4.39 cm2
Calc EF: 72.5 %
S' Lateral: 2 cm
Single Plane A2C EF: 67 %
Single Plane A4C EF: 78.8 %

## 2024-03-03 ENCOUNTER — Ambulatory Visit: Payer: Self-pay | Admitting: Cardiology

## 2024-03-15 ENCOUNTER — Encounter: Payer: Self-pay | Admitting: Oncology

## 2024-03-21 ENCOUNTER — Encounter: Payer: Self-pay | Admitting: Oncology

## 2024-03-21 ENCOUNTER — Inpatient Hospital Stay

## 2024-03-21 ENCOUNTER — Ambulatory Visit: Payer: Self-pay | Admitting: Oncology

## 2024-03-21 ENCOUNTER — Inpatient Hospital Stay: Attending: Oncology | Admitting: Oncology

## 2024-03-21 VITALS — BP 121/75 | HR 79 | Temp 97.5°F | Resp 18 | Ht 60.0 in | Wt 144.1 lb

## 2024-03-21 DIAGNOSIS — E538 Deficiency of other specified B group vitamins: Secondary | ICD-10-CM | POA: Insufficient documentation

## 2024-03-21 DIAGNOSIS — E7800 Pure hypercholesterolemia, unspecified: Secondary | ICD-10-CM

## 2024-03-21 DIAGNOSIS — Z8249 Family history of ischemic heart disease and other diseases of the circulatory system: Secondary | ICD-10-CM | POA: Insufficient documentation

## 2024-03-21 DIAGNOSIS — Z9884 Bariatric surgery status: Secondary | ICD-10-CM | POA: Insufficient documentation

## 2024-03-21 DIAGNOSIS — J449 Chronic obstructive pulmonary disease, unspecified: Secondary | ICD-10-CM | POA: Diagnosis not present

## 2024-03-21 DIAGNOSIS — D509 Iron deficiency anemia, unspecified: Secondary | ICD-10-CM | POA: Diagnosis present

## 2024-03-21 LAB — FERRITIN: Ferritin: 38 ng/mL (ref 11–307)

## 2024-03-21 LAB — CBC WITH DIFFERENTIAL/PLATELET
Abs Immature Granulocytes: 0.01 K/uL (ref 0.00–0.07)
Basophils Absolute: 0.1 K/uL (ref 0.0–0.1)
Basophils Relative: 1 %
Eosinophils Absolute: 0.2 K/uL (ref 0.0–0.5)
Eosinophils Relative: 4 %
HCT: 27.9 % — ABNORMAL LOW (ref 36.0–46.0)
Hemoglobin: 9.2 g/dL — ABNORMAL LOW (ref 12.0–15.0)
Immature Granulocytes: 0 %
Lymphocytes Relative: 37 %
Lymphs Abs: 1.7 K/uL (ref 0.7–4.0)
MCH: 25.3 pg — ABNORMAL LOW (ref 26.0–34.0)
MCHC: 33 g/dL (ref 30.0–36.0)
MCV: 76.6 fL — ABNORMAL LOW (ref 80.0–100.0)
Monocytes Absolute: 0.5 K/uL (ref 0.1–1.0)
Monocytes Relative: 10 %
Neutro Abs: 2.2 K/uL (ref 1.7–7.7)
Neutrophils Relative %: 48 %
Platelets: 243 K/uL (ref 150–400)
RBC: 3.64 MIL/uL — ABNORMAL LOW (ref 3.87–5.11)
RDW: 18.3 % — ABNORMAL HIGH (ref 11.5–15.5)
WBC: 4.5 K/uL (ref 4.0–10.5)
nRBC: 0 % (ref 0.0–0.2)

## 2024-03-21 LAB — FOLATE: Folate: 4.4 ng/mL — ABNORMAL LOW (ref >5.9)

## 2024-03-21 LAB — IRON AND TIBC
Iron: 127 ug/dL (ref 28–170)
Saturation Ratios: 28 % (ref 10.4–31.8)
TIBC: 462 ug/dL — ABNORMAL HIGH (ref 250–450)
UIBC: 335 ug/dL

## 2024-03-21 LAB — VITAMIN B12: Vitamin B-12: 325 pg/mL (ref 180–914)

## 2024-03-21 MED ORDER — FOLIC ACID 1 MG PO TABS: 1.0000 mg | ORAL_TABLET | Freq: Every day | ORAL | 0 refills | Status: AC

## 2024-03-21 NOTE — Assessment & Plan Note (Addendum)
Recommend patient to take folic acid supplementation.
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
## 2024-03-21 NOTE — Assessment & Plan Note (Signed)
 Order hereditary hypercoagulant workup. - Educated on blood clot symptoms and when to seek medical advice. - Advised on hydration and movement during long periods of immobilization.

## 2024-03-21 NOTE — Assessment & Plan Note (Addendum)
 MCV is chronically low.  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  Recommend trials of Ferrous sulfate  325mg  daily.  Repeat levels in 3 months, consider IV venofer if oral iron is not effective.

## 2024-03-21 NOTE — Assessment & Plan Note (Signed)
 At the risk of vitamin D deficiency.  Check TIBC ferritin, B12 and folate.

## 2024-03-22 ENCOUNTER — Inpatient Hospital Stay

## 2024-03-22 LAB — PROTEIN S, TOTAL AND FREE
Protein S Ag, Free: 121 % (ref 61–136)
Protein S Ag, Total: 95 % (ref 60–150)

## 2024-03-22 LAB — PROTEIN C ACTIVITY: Protein C Activity: 97 % (ref 73–180)

## 2024-03-22 NOTE — Progress Notes (Signed)
 [facility] Clinical Social Work  Clinical Social Work was referred by medical provider for financial concerns.  Clinical Social Worker contacted patient by phone to offer support and assess for needs.    Patient stated she had utilized [employer] in the past and does not wish to use them again.  She stated she was fine and requested no other assistance.   Interventions: Provided patient with information about Oncologist.       Follow Up Plan:  Patient will contact CSW with any support or resource needs    Macario CHRISTELLA Au, LCSW  Clinical Social Worker [facility]

## 2024-03-22 NOTE — Telephone Encounter (Signed)
-----   Message from Zelphia Cap sent at 03/21/2024  8:38 PM EDT ----- Please let patient know that folate level is low.  I recommend patient to start folic acid  supplementation.  Prescription sent to pharmacy. ----- Message ----- From: Rebecka, Lab In [place] Sent: 03/21/2024  11:25 AM EDT To: Zelphia Cap, MD

## 2024-03-22 NOTE — Telephone Encounter (Signed)
 Called patient to relate Dr. Layvonne recommendation no answer & unable to leave voicemail. Spoke to daughter who will relay message to patient

## 2024-03-25 LAB — PROTHROMBIN GENE MUTATION

## 2024-03-25 LAB — FACTOR 5 LEIDEN

## 2024-04-04 ENCOUNTER — Other Ambulatory Visit: Payer: Self-pay

## 2024-04-04 ENCOUNTER — Encounter: Payer: Self-pay | Admitting: Oncology

## 2024-04-04 MED ORDER — FERROUS SULFATE 325 (65 FE) MG PO TBEC: 325.0000 mg | DELAYED_RELEASE_TABLET | Freq: Three times a day (TID) | ORAL | 0 refills | Status: AC

## 2024-04-04 NOTE — Progress Notes (Signed)
 Spoke to pt and informed her of MD recommendation to start on Ferrous sulfate . Pt requsted medication be sent to [facility]. Appt details given to pt via phone and will also be mailed.

## 2024-04-04 NOTE — Addendum Note (Signed)
 Addended by: BABARA CALL on: 04/04/2024 09:01 AM   Modules accepted: Orders

## 2024-04-04 NOTE — Telephone Encounter (Signed)
 Dr. Babara recommends for pt to take oral iron (ferrous sulfate  325mg  daily).   Please schedule and I will contact pt with appt detail:  Labs in 3 months, then MD/ Venofer 1-3 days after labs.

## 2024-04-12 ENCOUNTER — Telehealth (HOSPITAL_COMMUNITY): Payer: Self-pay | Admitting: Emergency Medicine

## 2024-04-12 NOTE — Telephone Encounter (Signed)
 Attempted to call patient regarding upcoming cardiac CT appointment. Left message on voicemail with name and callback number Rockwell Alexandria RN Navigator Cardiac Imaging [facility] [phone] Office [phone] Cell

## 2024-04-15 ENCOUNTER — Ambulatory Visit: Admission: RE | Admit: 2024-04-15 | Source: Ambulatory Visit

## 2024-04-24 ENCOUNTER — Encounter: Payer: Self-pay | Admitting: Emergency Medicine

## 2024-04-24 ENCOUNTER — Emergency Department

## 2024-04-24 ENCOUNTER — Other Ambulatory Visit: Payer: Self-pay

## 2024-04-24 ENCOUNTER — Emergency Department: Admission: EM | Admit: 2024-04-24 | Discharge: 2024-04-24 | Disposition: A | Discharge status: Home / Self Care

## 2024-04-24 DIAGNOSIS — R911 Solitary pulmonary nodule: Secondary | ICD-10-CM

## 2024-04-24 DIAGNOSIS — R0789 Other chest pain: Secondary | ICD-10-CM | POA: Diagnosis present

## 2024-04-24 DIAGNOSIS — Y92410 Unspecified street and highway as the place of occurrence of the external cause: Secondary | ICD-10-CM | POA: Insufficient documentation

## 2024-04-24 DIAGNOSIS — R1030 Lower abdominal pain, unspecified: Secondary | ICD-10-CM | POA: Insufficient documentation

## 2024-04-24 DIAGNOSIS — I10 Essential (primary) hypertension: Secondary | ICD-10-CM | POA: Insufficient documentation

## 2024-04-24 DIAGNOSIS — M25562 Pain in left knee: Secondary | ICD-10-CM | POA: Diagnosis not present

## 2024-04-24 DIAGNOSIS — S20219A Contusion of unspecified front wall of thorax, initial encounter: Secondary | ICD-10-CM | POA: Diagnosis not present

## 2024-04-24 LAB — CBC WITH DIFFERENTIAL/PLATELET
Abs Immature Granulocytes: 0.01 K/uL (ref 0.00–0.07)
Basophils Absolute: 0 K/uL (ref 0.0–0.1)
Basophils Relative: 1 %
Eosinophils Absolute: 0.2 K/uL (ref 0.0–0.5)
Eosinophils Relative: 7 %
HCT: 33.8 % — ABNORMAL LOW (ref 36.0–46.0)
Hemoglobin: 11.2 g/dL — ABNORMAL LOW (ref 12.0–15.0)
Immature Granulocytes: 0 %
Lymphocytes Relative: 46 %
Lymphs Abs: 1.4 K/uL (ref 0.7–4.0)
MCH: 25.5 pg — ABNORMAL LOW (ref 26.0–34.0)
MCHC: 33.1 g/dL (ref 30.0–36.0)
MCV: 77 fL — ABNORMAL LOW (ref 80.0–100.0)
Monocytes Absolute: 0.3 K/uL (ref 0.1–1.0)
Monocytes Relative: 9 %
Neutro Abs: 1.1 K/uL — ABNORMAL LOW (ref 1.7–7.7)
Neutrophils Relative %: 37 %
Platelets: 259 K/uL (ref 150–400)
RBC: 4.39 MIL/uL (ref 3.87–5.11)
RDW: 14.4 % (ref 11.5–15.5)
WBC: 3.1 K/uL — ABNORMAL LOW (ref 4.0–10.5)
nRBC: 0 % (ref 0.0–0.2)

## 2024-04-24 LAB — URINALYSIS, ROUTINE W REFLEX MICROSCOPIC
Bilirubin Urine: NEGATIVE
Glucose, UA: NEGATIVE mg/dL
Hgb urine dipstick: NEGATIVE
Ketones, ur: NEGATIVE mg/dL
Nitrite: NEGATIVE
Protein, ur: NEGATIVE mg/dL
Specific Gravity, Urine: 1.01 (ref 1.005–1.030)
pH: 5 (ref 5.0–8.0)

## 2024-04-24 LAB — COMPREHENSIVE METABOLIC PANEL WITH GFR
ALT: 34 U/L (ref 0–44)
AST: 44 U/L — ABNORMAL HIGH (ref 15–41)
Albumin: 3.5 g/dL (ref 3.5–5.0)
Alkaline Phosphatase: 59 U/L (ref 38–126)
Anion gap: 11 (ref 5–15)
BUN: 11 mg/dL (ref 6–20)
CO2: 22 mmol/L (ref 22–32)
Calcium: 9.1 mg/dL (ref 8.9–10.3)
Chloride: 106 mmol/L (ref 98–111)
Creatinine, Ser: 0.69 mg/dL (ref 0.44–1.00)
GFR, Estimated: >60 mL/min (ref >60)
Glucose, Bld: 83 mg/dL (ref 70–99)
Potassium: 3.7 mmol/L (ref 3.5–5.1)
Sodium: 139 mmol/L (ref 135–145)
Total Bilirubin: 0.7 mg/dL (ref 0.0–1.2)
Total Protein: 7.1 g/dL (ref 6.5–8.1)

## 2024-04-24 LAB — TROPONIN I (HIGH SENSITIVITY): Troponin I (High Sensitivity): 12 ng/L (ref <18)

## 2024-04-24 MED ORDER — IOHEXOL 300 MG/ML  SOLN
100.0000 mL | Freq: Once | INTRAMUSCULAR | Status: AC | PRN
  Administered 2024-04-24: 100 mL via INTRAVENOUS

## 2024-04-24 MED ORDER — METHOCARBAMOL 500 MG PO TABS: 500.0000 mg | ORAL_TABLET | Freq: Three times a day (TID) | ORAL | 0 refills | Status: AC | PRN

## 2024-04-24 MED ORDER — NAPROXEN 500 MG PO TABS: 500.0000 mg | ORAL_TABLET | Freq: Two times a day (BID) | ORAL | 0 refills | Status: AC
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
## 2024-04-24 NOTE — Discharge Instructions (Signed)
 Please follow-up with your primary care provider.  If you are taking the methocarbamol , do not drive or operate machinery for at least 8 hours after the last dose.  Return to the emergency department for symptoms of concern if unable to see primary care.
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
## 2024-04-26 ENCOUNTER — Telehealth (HOSPITAL_COMMUNITY): Payer: Self-pay | Admitting: *Deleted

## 2024-04-26 MED ORDER — METOPROLOL TARTRATE 100 MG PO TABS: ORAL_TABLET | ORAL | 0 refills | Status: AC

## 2024-04-26 NOTE — Telephone Encounter (Signed)
 Reaching out to patient to offer assistance regarding upcoming cardiac imaging study; pt verbalizes understanding of appt date/time, parking situation and where to check in, pre-test NPO status and medications ordered, and verified current allergies; name and call back number provided for further questions should they arise Sid Seats RN Navigator Cardiac Imaging [facility] [phone] office [phone] cell

## 2024-04-29 ENCOUNTER — Ambulatory Visit: Admission: RE | Admit: 2024-04-29 | Source: Ambulatory Visit

## 2024-05-01 ENCOUNTER — Telehealth (HOSPITAL_COMMUNITY): Payer: Self-pay | Admitting: *Deleted

## 2024-05-01 NOTE — Telephone Encounter (Signed)

## 2024-05-02 ENCOUNTER — Ambulatory Visit
Admission: RE | Admit: 2024-05-02 | Discharge: 2024-05-02 | Disposition: A | Discharge status: Home / Self Care | Source: Ambulatory Visit | Attending: Cardiology | Admitting: Cardiology

## 2024-05-02 DIAGNOSIS — R072 Precordial pain: Secondary | ICD-10-CM | POA: Diagnosis present

## 2024-05-02 MED ORDER — NITROGLYCERIN 0.4 MG SL SUBL
0.8000 mg | SUBLINGUAL_TABLET | Freq: Once | SUBLINGUAL | Status: AC
  Administered 2024-05-02: 0.8 mg via SUBLINGUAL

## 2024-05-02 MED ORDER — METOPROLOL TARTRATE 5 MG/5ML IV SOLN
10.0000 mg | Freq: Once | INTRAVENOUS | Status: AC | PRN
  Administered 2024-05-02: 10 mg via INTRAVENOUS
  Filled 2024-05-02: qty 10

## 2024-05-02 MED ORDER — DILTIAZEM HCL 25 MG/5ML IV SOLN: 10.0000 mg | INTRAVENOUS | Status: DC | PRN

## 2024-05-02 MED ORDER — METOPROLOL TARTRATE 5 MG/5ML IV SOLN
INTRAVENOUS | Status: AC
  Filled 2024-05-02: qty 10

## 2024-05-02 MED ORDER — IOHEXOL 350 MG/ML SOLN
100.0000 mL | Freq: Once | INTRAVENOUS | Status: AC | PRN
  Administered 2024-05-02: 100 mL via INTRAVENOUS

## 2024-05-09 ENCOUNTER — Ambulatory Visit: Admitting: Cardiology

## 2024-05-09 ENCOUNTER — Other Ambulatory Visit: Payer: Self-pay

## 2024-05-10 ENCOUNTER — Telehealth: Payer: Self-pay | Admitting: Cardiology

## 2024-05-10 NOTE — Telephone Encounter (Signed)
 Spoke with patient and she reports elevated heart rate of 107 while she was at [employer]. She does not have any other heart rates at this time. Encouraged her to purchase finger pulse oximetry to monitor her heart rates. She is due for follow up so scheduled her appointment for October. She is just concerned with that elevated reading. We discussed how activity can sometimes cause fluctuates but given her concerns advised that I will send her message over to provider for his review. She was appreciative with no further questions.

## 2024-05-10 NOTE — Telephone Encounter (Signed)
   STAT if HR is under 50 or over 120  (normal HR is 60-100 beats per minute)  What is your heart rate? Not sure what it is today   Do you have a log of your heart rate readings (document readings)? 105 when she went to [employer] the other day, she states it is always this high.   Do you have any other symptoms? No    Pt called in stating she was speaking to someone earlier but does not remember who.

## 2024-05-13 NOTE — Telephone Encounter (Signed)
 Called patient regarding call from last week, told her what Dr Darliss had recommended for monitoring her heart rate and told her we will follow up about this when she comes in on 9/17. Patient verbalized understanding and was okay with this.

## 2024-05-20 ENCOUNTER — Encounter: Payer: Self-pay | Admitting: Oncology

## 2024-05-22 ENCOUNTER — Ambulatory Visit: Admitting: Cardiology

## 2024-06-06 ENCOUNTER — Ambulatory Visit: Admitting: Cardiology

## 2024-06-24 ENCOUNTER — Ambulatory Visit: Admitting: Cardiology

## 2024-06-26 ENCOUNTER — Ambulatory Visit: Admitting: Cardiology

## 2024-06-28 ENCOUNTER — Ambulatory Visit: Attending: Cardiology | Admitting: Cardiology

## 2024-06-28 ENCOUNTER — Encounter: Payer: Self-pay | Admitting: Cardiology

## 2024-06-28 VITALS — BP 148/80 | HR 89 | Ht 60.0 in | Wt 151.4 lb

## 2024-06-28 DIAGNOSIS — E7800 Pure hypercholesterolemia, unspecified: Secondary | ICD-10-CM | POA: Diagnosis not present

## 2024-06-28 DIAGNOSIS — I10 Essential (primary) hypertension: Secondary | ICD-10-CM | POA: Diagnosis not present

## 2024-06-28 DIAGNOSIS — F17200 Nicotine dependence, unspecified, uncomplicated: Secondary | ICD-10-CM

## 2024-06-28 DIAGNOSIS — I2510 Atherosclerotic heart disease of native coronary artery without angina pectoris: Secondary | ICD-10-CM

## 2024-06-28 MED ORDER — CARVEDILOL 25 MG PO TABS: 25.0000 mg | ORAL_TABLET | Freq: Two times a day (BID) | ORAL | 3 refills | Status: AC

## 2024-06-28 NOTE — Patient Instructions (Signed)
 Medication Instructions:  - STOP metoprolol   - START coreg 25 mg twice daily   *If you need a refill on your cardiac medications before your next appointment, please call your pharmacy*  Lab Work: Your provider would like for you to return in 3 MONTHS to have the following labs drawn: FASTING LIPID PANEL.   Please go to [facility] [address] ([facility]) [address], [place] [address] You do not need an appointment.  They are open from 8 am- 4:30 pm.  Lunch from 1:00 pm- 2:00 pm You DO need to be fasting.  If you have labs (blood work) drawn today and your tests are completely normal, you will receive your results only by: MyChart Message (if you have MyChart) OR A paper copy in the mail If you have any lab test that is abnormal or we need to change your treatment, we will call you to review the results.  Testing/Procedures: No test ordered today   Follow-Up: At [facility], you and your health needs are our priority.  As part of our continuing mission to provide you with exceptional heart care, our providers are all part of one team.  This team includes your primary Cardiologist (physician) and Advanced Practice Providers or APPs (Physician Assistants and Nurse Practitioners) who all work together to provide you with the care you need, when you need it.  Your next appointment:   4 month(s)  Provider:   You may see Dr Darliss or one of the following Advanced Practice Providers on your designated Care Team:   Lonni Meager, NP Lesley Maffucci, PA-C Bernardino Bring, PA-C Cadence Timberline-Fernwood, PA-C Tylene Lunch, NP Barnie Hila, NP    We recommend signing up for the patient portal called MyChart.  Sign up information is provided on this After Visit Summary.  MyChart is used to connect with patients for Virtual Visits (Telemedicine).  Patients are able to view lab/test results, encounter notes, upcoming appointments, etc.  Non-urgent messages can  be sent to your provider as well.   To learn more about what you can do with MyChart, go to [email or web address].
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
## 2024-07-01 ENCOUNTER — Encounter: Payer: Self-pay | Admitting: Oncology

## 2024-07-02 ENCOUNTER — Encounter: Payer: Self-pay | Admitting: Oncology

## 2024-07-05 ENCOUNTER — Inpatient Hospital Stay: Attending: Oncology

## 2024-07-05 DIAGNOSIS — D509 Iron deficiency anemia, unspecified: Secondary | ICD-10-CM | POA: Diagnosis present

## 2024-07-05 DIAGNOSIS — Z9884 Bariatric surgery status: Secondary | ICD-10-CM

## 2024-07-05 DIAGNOSIS — E538 Deficiency of other specified B group vitamins: Secondary | ICD-10-CM | POA: Diagnosis present

## 2024-07-05 DIAGNOSIS — Z832 Family history of diseases of the blood and blood-forming organs and certain disorders involving the immune mechanism: Secondary | ICD-10-CM | POA: Insufficient documentation

## 2024-07-05 LAB — RETIC PANEL
Immature Retic Fract: 16 % — ABNORMAL HIGH (ref 2.3–15.9)
RBC.: 4.58 MIL/uL (ref 3.87–5.11)
Retic Count, Absolute: 63.7 K/uL (ref 19.0–186.0)
Retic Ct Pct: 1.4 % (ref 0.4–3.1)
Reticulocyte Hemoglobin: 26.9 pg — ABNORMAL LOW (ref >27.9)

## 2024-07-05 LAB — IRON AND TIBC
Iron: 152 ug/dL (ref 28–170)
Saturation Ratios: 29 % (ref 10.4–31.8)
TIBC: 525 ug/dL — ABNORMAL HIGH (ref 250–450)
UIBC: 373 ug/dL

## 2024-07-05 LAB — FERRITIN: Ferritin: 21 ng/mL (ref 11–307)

## 2024-07-05 LAB — CBC (CANCER CENTER ONLY)
HCT: 34.2 % — ABNORMAL LOW (ref 36.0–46.0)
Hemoglobin: 11.4 g/dL — ABNORMAL LOW (ref 12.0–15.0)
MCH: 24.5 pg — ABNORMAL LOW (ref 26.0–34.0)
MCHC: 33.3 g/dL (ref 30.0–36.0)
MCV: 73.4 fL — ABNORMAL LOW (ref 80.0–100.0)
Platelet Count: 233 K/uL (ref 150–400)
RBC: 4.66 MIL/uL (ref 3.87–5.11)
RDW: 13.9 % (ref 11.5–15.5)
WBC Count: 3.8 K/uL — ABNORMAL LOW (ref 4.0–10.5)
nRBC: 0 % (ref 0.0–0.2)

## 2024-07-05 LAB — FOLATE: Folate: >20 ng/mL (ref >5.9)

## 2024-07-08 ENCOUNTER — Encounter: Payer: Self-pay | Admitting: Oncology

## 2024-07-08 ENCOUNTER — Inpatient Hospital Stay

## 2024-07-08 ENCOUNTER — Inpatient Hospital Stay: Attending: Oncology | Admitting: Oncology

## 2024-07-08 VITALS — BP 100/53 | HR 86 | Temp 95.7°F | Resp 18 | Wt 151.9 lb

## 2024-07-08 DIAGNOSIS — Z8249 Family history of ischemic heart disease and other diseases of the circulatory system: Secondary | ICD-10-CM

## 2024-07-08 DIAGNOSIS — Z9884 Bariatric surgery status: Secondary | ICD-10-CM | POA: Diagnosis not present

## 2024-07-08 DIAGNOSIS — D509 Iron deficiency anemia, unspecified: Secondary | ICD-10-CM | POA: Diagnosis not present

## 2024-07-08 DIAGNOSIS — E538 Deficiency of other specified B group vitamins: Secondary | ICD-10-CM | POA: Diagnosis not present

## 2024-07-08 NOTE — Assessment & Plan Note (Addendum)
 Folate level has normalized.

## 2024-07-08 NOTE — Assessment & Plan Note (Signed)
 Check folate and B12 periodically.

## 2024-07-08 NOTE — Assessment & Plan Note (Addendum)
  Lab Results  Component Value Date   HGB 11.4 (L) 07/05/2024   TIBC 525 (H) 07/05/2024   IRONPCTSAT 29 07/05/2024   FERRITIN 21 07/05/2024    No need for iron infusion.  MCV is chronically low.  Suspect underlying hemoglobinopathy check hemoglobinopathy evaluation.
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
## 2024-07-08 NOTE — Assessment & Plan Note (Signed)
 Labs reviewed and discussed with patient.  Negative factor V Leiden mutation, negative prothrombin gene mutation.  Normal protein C and S levels. - Educated on blood clot symptoms and when to seek medical advice. - Advised on hydration and movement during long periods of immobilization.

## 2024-08-26 ENCOUNTER — Other Ambulatory Visit: Payer: Self-pay | Admitting: Specialist

## 2024-08-26 DIAGNOSIS — R918 Other nonspecific abnormal finding of lung field: Secondary | ICD-10-CM

## 2024-08-26 DIAGNOSIS — J4520 Mild intermittent asthma, uncomplicated: Secondary | ICD-10-CM

## 2024-09-12 ENCOUNTER — Other Ambulatory Visit: Payer: Self-pay

## 2024-09-12 DIAGNOSIS — Z1231 Encounter for screening mammogram for malignant neoplasm of breast: Secondary | ICD-10-CM

## 2024-09-18 ENCOUNTER — Ambulatory Visit

## 2024-10-03 ENCOUNTER — Ambulatory Visit
Admission: RE | Admit: 2024-10-03 | Discharge: 2024-10-03 | Disposition: A | Discharge status: Home / Self Care | Source: Ambulatory Visit

## 2024-10-03 DIAGNOSIS — Z1231 Encounter for screening mammogram for malignant neoplasm of breast: Secondary | ICD-10-CM | POA: Diagnosis present

## 2024-10-07 ENCOUNTER — Ambulatory Visit: Admission: RE | Admit: 2024-10-07 | Source: Ambulatory Visit

## 2024-10-29 ENCOUNTER — Ambulatory Visit: Admitting: Cardiology

## 2025-01-06 ENCOUNTER — Inpatient Hospital Stay

## 2025-01-13 ENCOUNTER — Inpatient Hospital Stay

## 2025-01-13 ENCOUNTER — Inpatient Hospital Stay: Admitting: Oncology
